# Patient Record
Sex: Female | Born: 1935 | Race: White | Hispanic: No | Smoking: Never smoker
Health system: Southern US, Community
[De-identification: ages and names within clinical notes are randomized; demographics above are authoritative.]

## PROBLEM LIST (undated history)

## (undated) DIAGNOSIS — R1310 Dysphagia, unspecified: Secondary | ICD-10-CM

## (undated) DIAGNOSIS — M810 Age-related osteoporosis without current pathological fracture: Secondary | ICD-10-CM

## (undated) DIAGNOSIS — W19XXXA Unspecified fall, initial encounter: Secondary | ICD-10-CM

## (undated) DIAGNOSIS — I10 Essential (primary) hypertension: Secondary | ICD-10-CM

## (undated) DIAGNOSIS — M1990 Unspecified osteoarthritis, unspecified site: Secondary | ICD-10-CM

## (undated) HISTORY — DX: Essential (primary) hypertension: I10

## (undated) HISTORY — PX: HIP SURGERY: SHX245

## (undated) HISTORY — DX: Unspecified osteoarthritis, unspecified site: M19.90

---

## 2015-02-26 DIAGNOSIS — Z23 Encounter for immunization: Secondary | ICD-10-CM | POA: Diagnosis not present

## 2015-02-26 DIAGNOSIS — M1990 Unspecified osteoarthritis, unspecified site: Secondary | ICD-10-CM | POA: Diagnosis not present

## 2015-02-26 DIAGNOSIS — H9193 Unspecified hearing loss, bilateral: Secondary | ICD-10-CM | POA: Diagnosis not present

## 2015-02-26 DIAGNOSIS — Z1329 Encounter for screening for other suspected endocrine disorder: Secondary | ICD-10-CM | POA: Diagnosis not present

## 2015-02-26 DIAGNOSIS — R1310 Dysphagia, unspecified: Secondary | ICD-10-CM | POA: Diagnosis not present

## 2015-02-26 DIAGNOSIS — M810 Age-related osteoporosis without current pathological fracture: Secondary | ICD-10-CM | POA: Diagnosis not present

## 2015-02-26 DIAGNOSIS — Z0000 Encounter for general adult medical examination without abnormal findings: Secondary | ICD-10-CM | POA: Diagnosis not present

## 2015-02-26 DIAGNOSIS — R42 Dizziness and giddiness: Secondary | ICD-10-CM | POA: Diagnosis not present

## 2015-02-26 DIAGNOSIS — Z87898 Personal history of other specified conditions: Secondary | ICD-10-CM | POA: Diagnosis not present

## 2015-04-24 DIAGNOSIS — H04123 Dry eye syndrome of bilateral lacrimal glands: Secondary | ICD-10-CM | POA: Diagnosis not present

## 2015-06-26 DIAGNOSIS — Z23 Encounter for immunization: Secondary | ICD-10-CM | POA: Diagnosis not present

## 2015-06-26 DIAGNOSIS — D485 Neoplasm of uncertain behavior of skin: Secondary | ICD-10-CM | POA: Diagnosis not present

## 2015-06-26 DIAGNOSIS — H04123 Dry eye syndrome of bilateral lacrimal glands: Secondary | ICD-10-CM | POA: Diagnosis not present

## 2015-07-21 DIAGNOSIS — D485 Neoplasm of uncertain behavior of skin: Secondary | ICD-10-CM | POA: Diagnosis not present

## 2015-07-21 DIAGNOSIS — L905 Scar conditions and fibrosis of skin: Secondary | ICD-10-CM | POA: Diagnosis not present

## 2015-10-07 DIAGNOSIS — R1310 Dysphagia, unspecified: Secondary | ICD-10-CM | POA: Diagnosis not present

## 2015-10-07 DIAGNOSIS — R946 Abnormal results of thyroid function studies: Secondary | ICD-10-CM | POA: Diagnosis not present

## 2015-10-07 DIAGNOSIS — R531 Weakness: Secondary | ICD-10-CM | POA: Diagnosis not present

## 2015-10-07 DIAGNOSIS — R42 Dizziness and giddiness: Secondary | ICD-10-CM | POA: Diagnosis not present

## 2015-10-13 ENCOUNTER — Other Ambulatory Visit (HOSPITAL_COMMUNITY): Payer: Self-pay | Admitting: Family Medicine

## 2015-10-13 DIAGNOSIS — R1310 Dysphagia, unspecified: Secondary | ICD-10-CM

## 2015-10-15 ENCOUNTER — Ambulatory Visit (HOSPITAL_COMMUNITY): Admission: RE | Admit: 2015-10-15 | Payer: Commercial Managed Care - HMO | Source: Ambulatory Visit

## 2015-10-22 ENCOUNTER — Other Ambulatory Visit (HOSPITAL_COMMUNITY): Payer: Self-pay | Admitting: Family Medicine

## 2015-10-22 DIAGNOSIS — R1310 Dysphagia, unspecified: Secondary | ICD-10-CM

## 2015-10-30 ENCOUNTER — Ambulatory Visit (HOSPITAL_COMMUNITY): Payer: Commercial Managed Care - HMO | Attending: Family Medicine | Admitting: Speech Pathology

## 2015-10-30 ENCOUNTER — Encounter (HOSPITAL_COMMUNITY): Payer: Self-pay | Admitting: Speech Pathology

## 2015-10-30 ENCOUNTER — Encounter (HOSPITAL_COMMUNITY): Payer: Self-pay | Admitting: Emergency Medicine

## 2015-10-30 ENCOUNTER — Emergency Department (HOSPITAL_COMMUNITY)
Admission: EM | Admit: 2015-10-30 | Discharge: 2015-10-30 | Disposition: A | Discharge status: Home / Self Care | Payer: Commercial Managed Care - HMO | Attending: Emergency Medicine | Admitting: Emergency Medicine

## 2015-10-30 ENCOUNTER — Other Ambulatory Visit: Payer: Self-pay

## 2015-10-30 ENCOUNTER — Emergency Department (HOSPITAL_COMMUNITY): Payer: Commercial Managed Care - HMO

## 2015-10-30 ENCOUNTER — Ambulatory Visit (HOSPITAL_COMMUNITY)
Admission: RE | Admit: 2015-10-30 | Discharge: 2015-10-30 | Disposition: A | Discharge status: Home / Self Care | Payer: Commercial Managed Care - HMO | Source: Ambulatory Visit | Attending: Family Medicine | Admitting: Family Medicine

## 2015-10-30 DIAGNOSIS — Z79899 Other long term (current) drug therapy: Secondary | ICD-10-CM | POA: Diagnosis not present

## 2015-10-30 DIAGNOSIS — R0789 Other chest pain: Secondary | ICD-10-CM | POA: Diagnosis not present

## 2015-10-30 DIAGNOSIS — R1310 Dysphagia, unspecified: Secondary | ICD-10-CM | POA: Insufficient documentation

## 2015-10-30 DIAGNOSIS — T17300A Unspecified foreign body in larynx causing asphyxiation, initial encounter: Secondary | ICD-10-CM | POA: Diagnosis not present

## 2015-10-30 DIAGNOSIS — J449 Chronic obstructive pulmonary disease, unspecified: Secondary | ICD-10-CM | POA: Diagnosis not present

## 2015-10-30 DIAGNOSIS — R634 Abnormal weight loss: Secondary | ICD-10-CM

## 2015-10-30 HISTORY — DX: Unspecified fall, initial encounter: W19.XXXA

## 2015-10-30 HISTORY — DX: Dysphagia, unspecified: R13.10

## 2015-10-30 HISTORY — DX: Age-related osteoporosis without current pathological fracture: M81.0

## 2015-10-30 LAB — CBC WITH DIFFERENTIAL/PLATELET
BASOS ABS: 0 10*3/uL (ref 0.0–0.1)
BASOS PCT: 0 %
EOS PCT: 0 %
Eosinophils Absolute: 0 10*3/uL (ref 0.0–0.7)
HEMATOCRIT: 39.5 % (ref 36.0–46.0)
Hemoglobin: 13.1 g/dL (ref 12.0–15.0)
LYMPHS PCT: 8 %
Lymphs Abs: 0.5 10*3/uL — ABNORMAL LOW (ref 0.7–4.0)
MCH: 29 pg (ref 26.0–34.0)
MCHC: 33.2 g/dL (ref 30.0–36.0)
MCV: 87.6 fL (ref 78.0–100.0)
MONO ABS: 0.4 10*3/uL (ref 0.1–1.0)
MONOS PCT: 7 %
Neutro Abs: 4.8 10*3/uL (ref 1.7–7.7)
Neutrophils Relative %: 85 %
PLATELETS: 262 10*3/uL (ref 150–400)
RBC: 4.51 MIL/uL (ref 3.87–5.11)
RDW: 13 % (ref 11.5–15.5)
WBC: 5.7 10*3/uL (ref 4.0–10.5)

## 2015-10-30 LAB — TROPONIN I
Troponin I: <0.03 ng/mL (ref <0.031)
Troponin I: <0.03 ng/mL (ref <0.031)

## 2015-10-30 LAB — BASIC METABOLIC PANEL
Anion gap: 7 (ref 5–15)
BUN: 20 mg/dL (ref 6–20)
CALCIUM: 9.6 mg/dL (ref 8.9–10.3)
CO2: 25 mmol/L (ref 22–32)
Chloride: 102 mmol/L (ref 101–111)
Creatinine, Ser: 0.88 mg/dL (ref 0.44–1.00)
GFR calc Af Amer: >60 mL/min (ref >60)
GFR, EST NON AFRICAN AMERICAN: 54 mL/min — AB (ref >60)
GLUCOSE: 110 mg/dL — AB (ref 65–99)
Potassium: 4.4 mmol/L (ref 3.5–5.1)
Sodium: 134 mmol/L — ABNORMAL LOW (ref 135–145)

## 2015-10-30 MED ORDER — TRAMADOL HCL 50 MG PO TABS: 50.0000 mg | ORAL_TABLET | Freq: Four times a day (QID) | ORAL | Status: DC | PRN

## 2015-10-30 NOTE — Therapy (Signed)
[place], [place], [address] Phone: [phone]   Fax:  [phone]  Modified Barium Swallow  Patient Details  Name: Gina Finley MRN: [id number] Date of Birth: Jan 01, 1918 No Data Recorded  Encounter Date: 10/30/2015      End of Session - 10/30/15 1838    Visit Number 1   Number of Visits 1   Authorization Type [employer]   SLP Start Time [id number]   SLP Stop Time  1400   SLP Time Calculation (min) 42 min   Activity Tolerance Patient tolerated treatment well;Other (comment)  Pt sat forcefully in chair and then felt pain in substernal area      Past Medical History  Diagnosis Date  . Dysphagia   . Osteoporosis   . Fall     Past Surgical History  Procedure Laterality Date  . Hip surgery      There were no vitals filed for this visit.  Visit Diagnosis: Dysphagia      Subjective Assessment - 10/30/15 1818    Subjective "I have trouble swallowing meats and mushy stuff...bananas."   Special Tests MBSS   Currently in Pain? No/denies             General - 10/30/15 1819    General Information   Date of Onset 10/07/15   HPI Ms. Damitra Santry is a 82 yo woman who was referred by Dr. Aura Dials for MBSS secondary to pt report of difficulty swallowing. She had MBSS in [place] in 2013 which showed aspiration of thin liquids (recommendation for mechanical soft and nectar-thick liquids). She only takes one medication, lives with her daughter, and consumes regular textures and all liquids. Her daughter accompanied her to the evaluation.   Type of Study MBS-Modified Barium Swallow Study   Previous Swallow Assessment 05/25/2012: aspiration of thin liquids with recommendation for mechanical soft textures and nectar-thick liquids   Diet Prior to this Study Regular;Thin liquids   Temperature Spikes Noted No   Respiratory Status Room air   History of Recent Intubation No   Behavior/Cognition  Alert;Cooperative;Pleasant mood   Oral Cavity Assessment Within Functional Limits   Oral Care Completed by SLP No   Oral Cavity - Dentition Dentures, top;Dentures, bottom   Vision Functional for self feeding   Self-Feeding Abilities Able to feed self   Patient Positioning Upright in chair   Baseline Vocal Quality Normal   Volitional Cough Weak   Volitional Swallow Able to elicit   Anatomy Within functional limits   Pharyngeal Secretions Not observed secondary MBS            Oral Preparation/Oral Phase - 10/30/15 1831    Oral Preparation/Oral Phase   Oral Phase Impaired   Oral - Nectar   Oral - Nectar Cup Piecemeal swallowing;Weak ligual manipulation   Oral - Thin   Oral - Thin Cup Piecemeal swallowing;Weak ligual manipulation   Oral - Solids   Oral - Puree Piecemeal swallowing;Weak ligual manipulation   Oral - Regular Piecemeal swallowing;Weak ligual manipulation   Electrical stimulation - Oral Phase   Was Electrical Stimulation Used No          Pharyngeal Phase - 10/30/15 1832    Pharyngeal Phase   Pharyngeal Phase Impaired   Pharyngeal - Nectar   Pharyngeal- Nectar Cup Delayed swallow initiation;Swallow initiation at pyriform sinus;Reduced epiglottic inversion;Reduced airway/laryngeal closure;Reduced tongue base retraction;Penetration/Aspiration during swallow;Trace aspiration;Pharyngeal residue - valleculae;Pharyngeal residue - pyriform   Pharyngeal Material does  not enter airway;Material enters airway, passes BELOW cords then ejected out;Material enters airway, remains ABOVE vocal cords and not ejected out;Material enters airway, remains ABOVE vocal cords then ejected out   Pharyngeal - Thin   Pharyngeal- Thin Cup Delayed swallow initiation;Swallow initiation at pyriform sinus;Reduced epiglottic inversion;Reduced airway/laryngeal closure;Reduced tongue base retraction;Penetration/Aspiration during swallow;Trace aspiration;Pharyngeal residue - valleculae;Pharyngeal  residue - pyriform   Pharyngeal Material does not enter airway;Material enters airway, remains ABOVE vocal cords then ejected out;Material enters airway, remains ABOVE vocal cords and not ejected out;Material enters airway, passes BELOW cords without attempt by patient to eject out (silent aspiration);Material enters airway, passes BELOW cords then ejected out   Pharyngeal- Thin Straw Delayed swallow initiation;Swallow initiation at pyriform sinus;Reduced epiglottic inversion;Reduced airway/laryngeal closure;Reduced tongue base retraction;Penetration/Aspiration during swallow;Trace aspiration;Pharyngeal residue - valleculae;Pharyngeal residue - pyriform   Pharyngeal Material enters airway, passes BELOW cords without attempt by patient to eject out (silent aspiration);Material enters airway, passes BELOW cords then ejected out   Pharyngeal - Solids   Pharyngeal- Puree Delayed swallow initiation;Swallow initiation at vallecula;Reduced epiglottic inversion;Reduced tongue base retraction;Pharyngeal residue - valleculae;Pharyngeal residue - pyriform   Pharyngeal- Regular Delayed swallow initiation-vallecula;Reduced epiglottic inversion;Reduced tongue base retraction;Pharyngeal residue - valleculae;Pharyngeal residue - pyriform   Pharyngeal- Pill Not tested   Electrical Stimulation - Pharyngeal Phase   Was Electrical Stimulation Used No          Cricopharyngeal Phase - 10/30/15 1836    Cervical Esophageal Phase   Cervical Esophageal Phase Within functional limits  ? hiatal hernia                  Plan - 10/30/15 1839    Clinical Impression Statement Ms. Steinberger was accompanied by her daughter for today's MBSS. Pt has a history of dysphagia with a previous MBSS completed in 2013 with recommendation for mechanical soft textures and nectar-thick liquids, however she was upgraded to thin liquids and typical diet is regular textures with thin liquids. The patient reports difficulty swallowing  meats and "mushy food". She denies difficulty with liquids, however her daughter endorses occasional "choking" on liquids. Pt sat down on cushioned [place] chair rather forcefully and then a few minutes later complained of substernal pain. The study was completed and pt still in pain, so she was accompanied to the ER to see a physician.   Pt with moderate oropharyngeal phase dysphagia characterized by weak lingual manipulation and coordination resulting in difficulty forming cohesive bolus, piecemeal deglutition, premature spillage of all liquids with swallow trigger after spilling to the pyriforms; Pt with decreased tongue base retraction (weakness), epiglottic deflection, and laryngeal vestibule closure resulting in penetration of thin liquids (can be normal in advanced age) and trace aspiration of thins and nectars. Pt with mild/mod valleculare residue and pyriform residue after primary swallow. Aspiration of nectars was silent, but was quickly expelled from larynx spontaneously (quickly dipped beneath vocal folds and then removed). Pt did aspirate trace amounts of thin liquids and was only unable to remove one episode (first episode). She was subsequently cued to clear her throat/cough after sips of liquid which was effective in removing aspirate (however some still remained on underside of epiglottis). Daughter and pt deny history of pneumonia or frequent respiratory infections. I suspect some degree of trace chronic aspiration of thin liquids to which pt seems to tolerate, likely due to her excellent mobility status for age. Given good health, would recommend allowing thin liquids with use of strategies (small sip, no straw, clear throat/cough after each  sip and repeat with a dry swallow) and self-regulated regular textures (ie. slow cooked, tender meats). Pt will need to swallow 2-3x for each bite/sip to help clear pharynx. If appetite is poor, consider liquid supplements (boost, ensure) for ease of intake.  SLP cautioned that pt may need to have liquids thickened in the future if she becomes compromised (fall, hospitalization, URI). They were given written recommendations and contact information should they have further questions. Pt/daughter in agreement with plan.    Consulted and Agree with Plan of Care Patient;Family member/caregiver   Family Member Consulted daughter          G-Codes - 2015/11/02 1859    Functional Assessment Tool Used clinical judgment; MBSS   Functional Limitations Swallowing   Swallow Current Status [phone]) At least 1 percent but less than 20 percent impaired, limited or restricted   Swallow Discharge Status [phone]) At least 1 percent but less than 20 percent impaired, limited or restricted          Recommendations/Treatment - 2015/11/02 1836    Swallow Evaluation Recommendations   SLP Diet Recommendations Dysphagia 3 (mechanical soft);Thin;Nectar   Liquid Administration via [employer];No straw   Medication Administration Whole meds with puree   Supervision Patient able to self feed;Intermittent supervision to cue for compensatory strategies   Compensations Slow rate;Small sips/bites;Multiple dry swallows after each bite/sip;Clear throat intermittently;Effortful swallow   Postural Changes Seated upright at 90 degrees;Remain upright for at least 30 minutes after feeds/meals          Prognosis - November 02, 2015 1838    Prognosis   Prognosis for Safe Diet Advancement Fair   Barriers to Reach Goals Severity of deficits   Individuals Consulted   Consulted and Agree with Results and Recommendations Patient;Family member/caregiver   Family Member Consulted Daughter   Report Sent to  Referring physician      Problem List There are no active problems to display for this patient.  Thank you,  Genene Churn, [place]  [facility] 11/02/2015, [place] [address] [place], [place], [address] Phone:  [phone]   Fax:  [phone]  Name: Yasheka Halleran MRN: [id number] Date of Birth: [birth year]

## 2015-10-30 NOTE — ED Provider Notes (Signed)
CSN: [id number]     Arrival date & time 10/30/15  1347 History   First MD Initiated Contact with Patient 10/30/15 1508     Chief Complaint  Patient presents with  . Chest Pain     (Consider location/radiation/quality/duration/timing/severity/associated sxs/prior Treatment) The history is provided by the patient and a relative.   82 year old the woman felt fine earlier today was scheduled for a barium swallow study due to difficulty swallowing. Patient when she went there is head down forcefully and then started to complain of pain in her lower substernal area and lower chest area. Having no difficulty breathing with it. I brought over here for further evaluation. Vital signs did note a low-grade fever at 100.2. Patient did not feel like she has been sick. Has not had chest pain like this before. Patient patient states that it is present only with breathing and with movement.  Past Medical History  Diagnosis Date  . Dysphagia   . Osteoporosis   . Fall    Past Surgical History  Procedure Laterality Date  . Hip surgery     No family history on file. Social History  Substance Use Topics  . Smoking status: Never Smoker   . Smokeless tobacco: Not on file  . Alcohol Use: No   OB History    No data available     Review of Systems  Constitutional: Negative for fever.  HENT: Negative for congestion.   Eyes: Negative for visual disturbance.  Respiratory: Negative for shortness of breath.   Cardiovascular: Positive for chest pain.  Gastrointestinal: Negative for nausea, vomiting and abdominal pain.  Genitourinary: Negative for dysuria.  Musculoskeletal: Negative for back pain.  Skin: Negative for rash.  Neurological: Negative for headaches.  Hematological: Does not bruise/bleed easily.  Psychiatric/Behavioral: Negative for confusion.      Allergies  Review of patient's allergies indicates no known allergies.  Home Medications   Prior to Admission medications   Medication  Sig Start Date End Date Taking? Authorizing Provider  metoprolol succinate (TOPROL-XL) 25 MG 24 hr tablet  10/15/15  Yes Historical Provider, MD  traMADol (ULTRAM) 50 MG tablet Take 1 tablet (50 mg total) by mouth every 6 (six) hours as needed. 10/30/15   Fredia Sorrow, MD   BP 161/75 mmHg  Pulse 77  Temp(Src) 100.2 F (37.9 C) (Oral)  Resp 18  Ht 5\' 4"  (1.626 m)  Wt 37.195 kg  BMI 14.07 kg/m2  SpO2 97% Physical Exam  Constitutional: She is oriented to person, place, and time. She appears well-developed and well-nourished. No distress.  HENT:  Head: Normocephalic and atraumatic.  Mouth/Throat: Oropharynx is clear and moist.  Eyes: Conjunctivae and EOM are normal. Pupils are equal, round, and reactive to light.  Neck: Normal range of motion. Neck supple.  Cardiovascular: Normal rate, regular rhythm and normal heart sounds.   No murmur heard. Pulmonary/Chest: Effort normal and breath sounds normal. No respiratory distress.  Abdominal: Bowel sounds are normal. There is no tenderness.  Musculoskeletal: Normal range of motion. She exhibits no edema.  Neurological: She is alert and oriented to person, place, and time. No cranial nerve deficit. She exhibits normal muscle tone. Coordination normal.  Baseline heart appearing and now slight speech impediment. All baseline.  Nursing note and vitals reviewed.   ED Course  Procedures (including critical care time) Labs Review Labs Reviewed  BASIC METABOLIC PANEL - Abnormal; Notable for the following:    Sodium 134 (*)    Glucose, Bld 110 (*)  GFR calc non Af Amer 54 (*)    All other components within normal limits  CBC WITH DIFFERENTIAL/PLATELET - Abnormal; Notable for the following:    Lymphs Abs 0.5 (*)    All other components within normal limits  TROPONIN I  TROPONIN I   Results for orders placed or performed during the hospital encounter of [id number]  Basic metabolic panel  Result Value Ref Range   Sodium 134 (L) 135 - 145  mmol/L   Potassium 4.4 3.5 - 5.1 mmol/L   Chloride 102 101 - 111 mmol/L   CO2 25 22 - 32 mmol/L   Glucose, Bld 110 (H) 65 - 99 mg/dL   BUN 20 6 - 20 mg/dL   Creatinine, Ser 0.88 0.44 - 1.00 mg/dL   Calcium 9.6 8.9 - 10.3 mg/dL   GFR calc non Af Amer 54 (L) >60 mL/min   GFR calc Af Amer >60 >60 mL/min   Anion gap 7 5 - 15  CBC with Differential/Platelet  Result Value Ref Range   WBC 5.7 4.0 - 10.5 K/uL   RBC 4.51 3.87 - 5.11 MIL/uL   Hemoglobin 13.1 12.0 - 15.0 g/dL   HCT 39.5 36.0 - 46.0 %   MCV 87.6 78.0 - 100.0 fL   MCH 29.0 26.0 - 34.0 pg   MCHC 33.2 30.0 - 36.0 g/dL   RDW 13.0 11.5 - 15.5 %   Platelets 262 150 - 400 K/uL   Neutrophils Relative % 85 %   Neutro Abs 4.8 1.7 - 7.7 K/uL   Lymphocytes Relative 8 %   Lymphs Abs 0.5 (L) 0.7 - 4.0 K/uL   Monocytes Relative 7 %   Monocytes Absolute 0.4 0.1 - 1.0 K/uL   Eosinophils Relative 0 %   Eosinophils Absolute 0.0 0.0 - 0.7 K/uL   Basophils Relative 0 %   Basophils Absolute 0.0 0.0 - 0.1 K/uL  Troponin I  Result Value Ref Range   Troponin I <0.03 <0.031 ng/mL  Troponin I  Result Value Ref Range   Troponin I <0.03 <0.031 ng/mL   Results for orders placed or performed during the hospital encounter of [id number]  Basic metabolic panel  Result Value Ref Range   Sodium 134 (L) 135 - 145 mmol/L   Potassium 4.4 3.5 - 5.1 mmol/L   Chloride 102 101 - 111 mmol/L   CO2 25 22 - 32 mmol/L   Glucose, Bld 110 (H) 65 - 99 mg/dL   BUN 20 6 - 20 mg/dL   Creatinine, Ser 0.88 0.44 - 1.00 mg/dL   Calcium 9.6 8.9 - 10.3 mg/dL   GFR calc non Af Amer 54 (L) >60 mL/min   GFR calc Af Amer >60 >60 mL/min   Anion gap 7 5 - 15  CBC with Differential/Platelet  Result Value Ref Range   WBC 5.7 4.0 - 10.5 K/uL   RBC 4.51 3.87 - 5.11 MIL/uL   Hemoglobin 13.1 12.0 - 15.0 g/dL   HCT 39.5 36.0 - 46.0 %   MCV 87.6 78.0 - 100.0 fL   MCH 29.0 26.0 - 34.0 pg   MCHC 33.2 30.0 - 36.0 g/dL   RDW 13.0 11.5 - 15.5 %   Platelets 262 150 - 400 K/uL    Neutrophils Relative % 85 %   Neutro Abs 4.8 1.7 - 7.7 K/uL   Lymphocytes Relative 8 %   Lymphs Abs 0.5 (L) 0.7 - 4.0 K/uL   Monocytes Relative 7 %   Monocytes Absolute 0.4  0.1 - 1.0 K/uL   Eosinophils Relative 0 %   Eosinophils Absolute 0.0 0.0 - 0.7 K/uL   Basophils Relative 0 %   Basophils Absolute 0.0 0.0 - 0.1 K/uL  Troponin I  Result Value Ref Range   Troponin I <0.03 <0.031 ng/mL  Troponin I  Result Value Ref Range   Troponin I <0.03 <0.031 ng/mL     Imaging Review Dg Chest 2 View  10/30/2015  CLINICAL DATA:  Difficulty swallowing, pain in substernal area after sitting down forcefully EXAM: CHEST  2 VIEW COMPARISON:  None FINDINGS: Normal heart size and pulmonary vascularity. Atherosclerotic calcification aorta. Large hiatal hernia. Emphysematous changes without infiltrate, pleural effusion or pneumothorax. Diffuse osseous demineralization. Scattered smooth concavities in vertebral endplates. No definite fractures are evident though sensitivity for fractures is decreased in the setting of marked demineralization. No retrosternal soft tissue density is definitely seen. IMPRESSION: Large hiatal hernia. COPD changes. Marked osseous demineralization without definite acute bony abnormalities. Electronically Signed   By: Lavonia Dana M.D.   On: 10/30/2015 14:25   Dg Op Swallowing Func-medicare/speech Path  10/30/2015  CLINICAL DATA:  Dysphagia for meats EXAM: MODIFIED BARIUM SWALLOW TECHNIQUE: Different consistencies of barium were administered orally to the patient by the Speech Pathologist. Imaging of the pharynx was performed in the lateral projection. FLUOROSCOPY TIME:  Radiation Exposure Index (as provided by the fluoroscopic device): Not provided If the device does not provide the exposure index: Fluoroscopy Time:  2 minutes 36 seconds Number of Acquired Images:  Single screen capture during fluoroscopy COMPARISON:  None FINDINGS: Thin liquid- with multiple sips of thin barium by cup,  premature spillover of contrast to the piriform sinuses is identified. Delayed initiation noted. Laryngeal penetration seen without aspiration initially. Minimal vallecular residual. With sequential swallows of thin barium by straw, premature spill over, delayed initiation, laryngeal penetration and aspiration of contrast were identified. No spontaneous cough reflex noted. Minimal vallecular residuals. Subsequent sip of thin barium by cup also demonstrated laryngeal penetration and aspiration with out cough reflex. Minimal vallecular residual. Nectar thick liquid- single swallow by cup demonstrated premature spillover to the vallecula and piriform sinuses, delayed initiation, laryngeal penetration and aspiration of contrast. No spontaneous cough reflex. Mild vallecular residuals noted, more than were seen with thin barium. Honey- not evaluated Pure- slightly delayed initiation. No laryngeal penetration or aspiration of applesauce consistency was seen. Oral and vallecular piriform sinus residuals were seen. Cracker-premature spillover to the vallecula with delayed initiation. No laryngeal penetration or aspiration. Vallecular residuals noted. Pure with cracker- not evaluated Barium tablet -  not evaluated IMPRESSION: Swallowing dysfunction as above. Please refer to the Speech Pathologists report for complete details and recommendations. Electronically Signed   By: Lavonia Dana M.D.   On: 10/30/2015 14:22   I have personally reviewed and evaluated these images and lab results as part of my medical decision-making.   EKG Interpretation None      ED ECG REPORT   Date: 10/30/2015  Rate: 76  Rhythm: normal sinus rhythm  QRS Axis: normal  Intervals: normal  ST/T Wave abnormalities: nonspecific T wave changes  Conduction Disutrbances:none  Narrative Interpretation:   Old EKG Reviewed: none available Abnormal R wave Progression early transition. Artifact but not limiting to interpretation. I have  personally reviewed the EKG tracing and agree with the computerized printout as noted.       MDM   Final diagnoses:  Chest wall pain    Workup for the chest pain clinically seem to  be more chest wall in nature. Workup without any findings to be suggestive of an acute coronary syndrome. EKG without acute changes troponins 2 are negative. Chest x-rays negative for pneumonia pneumothorax or pulmonary edema. Patient's symptoms are worse with movement and taking a deep breath. Addition also not concerned about pulmonary embolus. Oxygen saturation saturations are in the upper 90s. Patient without a leukocytosis. No tachycardia. Patient did have a low-grade fever upon presentation. Possibly could be coming down with an upper respiratory infection. Close observation will required for that. Patient certainly nontoxic no acute distress here.    Fredia Sorrow, MD 10/30/15 [phone]

## 2015-10-30 NOTE — Discharge Instructions (Signed)
A workup for the chest pain seems to be consistent with chest wall pain. Take the tramadol as needed for pain. Return for any new or worse symptoms.

## 2015-10-30 NOTE — ED Notes (Signed)
Pt having modified barium swallow study this morning for difficulty swallowing this morning.  Pt sat down forcefully and now has pain in substernal area.  Pt having no breathing difficulty at this time.  Pt brought in by [place] porter, speech therapy [phone] if needed. Pt alert and oriented.

## 2016-01-19 DIAGNOSIS — Z961 Presence of intraocular lens: Secondary | ICD-10-CM | POA: Diagnosis not present

## 2016-01-19 DIAGNOSIS — Z0100 Encounter for examination of eyes and vision without abnormal findings: Secondary | ICD-10-CM | POA: Diagnosis not present

## 2016-01-19 DIAGNOSIS — H04123 Dry eye syndrome of bilateral lacrimal glands: Secondary | ICD-10-CM | POA: Diagnosis not present

## 2016-01-20 DIAGNOSIS — R42 Dizziness and giddiness: Secondary | ICD-10-CM | POA: Diagnosis not present

## 2016-01-29 DIAGNOSIS — R42 Dizziness and giddiness: Secondary | ICD-10-CM | POA: Diagnosis not present

## 2016-01-29 DIAGNOSIS — I10 Essential (primary) hypertension: Secondary | ICD-10-CM | POA: Diagnosis not present

## 2016-04-08 DIAGNOSIS — R55 Syncope and collapse: Secondary | ICD-10-CM | POA: Diagnosis not present

## 2016-04-08 DIAGNOSIS — R42 Dizziness and giddiness: Secondary | ICD-10-CM | POA: Diagnosis not present

## 2016-04-08 DIAGNOSIS — R000 Tachycardia, unspecified: Secondary | ICD-10-CM | POA: Diagnosis not present

## 2016-04-08 DIAGNOSIS — I10 Essential (primary) hypertension: Secondary | ICD-10-CM | POA: Diagnosis not present

## 2016-05-07 DIAGNOSIS — R55 Syncope and collapse: Secondary | ICD-10-CM | POA: Diagnosis not present

## 2016-05-24 DIAGNOSIS — R42 Dizziness and giddiness: Secondary | ICD-10-CM | POA: Diagnosis not present

## 2016-05-24 DIAGNOSIS — R55 Syncope and collapse: Secondary | ICD-10-CM | POA: Diagnosis not present

## 2016-05-24 DIAGNOSIS — R000 Tachycardia, unspecified: Secondary | ICD-10-CM | POA: Diagnosis not present

## 2016-05-24 DIAGNOSIS — I10 Essential (primary) hypertension: Secondary | ICD-10-CM | POA: Diagnosis not present

## 2016-06-09 ENCOUNTER — Emergency Department (HOSPITAL_COMMUNITY): Payer: Commercial Managed Care - HMO

## 2016-06-09 ENCOUNTER — Encounter (HOSPITAL_COMMUNITY): Payer: Self-pay | Admitting: Emergency Medicine

## 2016-06-09 ENCOUNTER — Emergency Department (HOSPITAL_COMMUNITY)
Admission: EM | Admit: 2016-06-09 | Discharge: 2016-06-09 | Disposition: A | Discharge status: Home / Self Care | Payer: Commercial Managed Care - HMO | Attending: Emergency Medicine | Admitting: Emergency Medicine

## 2016-06-09 DIAGNOSIS — Y999 Unspecified external cause status: Secondary | ICD-10-CM | POA: Diagnosis not present

## 2016-06-09 DIAGNOSIS — M79652 Pain in left thigh: Secondary | ICD-10-CM | POA: Diagnosis not present

## 2016-06-09 DIAGNOSIS — Z7982 Long term (current) use of aspirin: Secondary | ICD-10-CM | POA: Diagnosis not present

## 2016-06-09 DIAGNOSIS — Y939 Activity, unspecified: Secondary | ICD-10-CM | POA: Insufficient documentation

## 2016-06-09 DIAGNOSIS — S2241XA Multiple fractures of ribs, right side, initial encounter for closed fracture: Secondary | ICD-10-CM | POA: Insufficient documentation

## 2016-06-09 DIAGNOSIS — Y929 Unspecified place or not applicable: Secondary | ICD-10-CM | POA: Diagnosis not present

## 2016-06-09 DIAGNOSIS — Z79899 Other long term (current) drug therapy: Secondary | ICD-10-CM | POA: Insufficient documentation

## 2016-06-09 DIAGNOSIS — S70921A Unspecified superficial injury of right thigh, initial encounter: Secondary | ICD-10-CM | POA: Diagnosis not present

## 2016-06-09 DIAGNOSIS — S299XXA Unspecified injury of thorax, initial encounter: Secondary | ICD-10-CM | POA: Diagnosis present

## 2016-06-09 DIAGNOSIS — W010XXA Fall on same level from slipping, tripping and stumbling without subsequent striking against object, initial encounter: Secondary | ICD-10-CM | POA: Insufficient documentation

## 2016-06-09 DIAGNOSIS — S3993XA Unspecified injury of pelvis, initial encounter: Secondary | ICD-10-CM | POA: Diagnosis not present

## 2016-06-09 DIAGNOSIS — S70922A Unspecified superficial injury of left thigh, initial encounter: Secondary | ICD-10-CM | POA: Diagnosis not present

## 2016-06-09 DIAGNOSIS — M79651 Pain in right thigh: Secondary | ICD-10-CM | POA: Diagnosis not present

## 2016-06-09 DIAGNOSIS — M25559 Pain in unspecified hip: Secondary | ICD-10-CM | POA: Diagnosis not present

## 2016-06-09 DIAGNOSIS — W19XXXA Unspecified fall, initial encounter: Secondary | ICD-10-CM

## 2016-06-09 MED ORDER — TRAMADOL HCL 50 MG PO TABS: 50.0000 mg | ORAL_TABLET | Freq: Four times a day (QID) | ORAL | 1 refills | Status: DC | PRN

## 2016-06-09 NOTE — Discharge Instructions (Signed)
You have rib fractures on the right side. These may be old. No obvious pelvic or femur fracture.  Try Tylenol for pain. I've given you a prescription pain pill. Try one half of these tablets if you need more pain medication.

## 2016-06-09 NOTE — ED Provider Notes (Signed)
[facility] DEPT Provider Note   CSN: [id number] Arrival date & time: 06/09/16  1224   By signing my name below, I, Gina Finley, attest that this documentation has been prepared under the direction and in the presence of Gina Christen, MD . Electronically Signed: Neta Finley, ED Scribe. 06/09/2016. 1:59 PM.   History   Chief Complaint Chief Complaint  Patient presents with  . Fall    The history is provided by the patient. No language interpreter was used.   HPI Comments:  Gina Finley is a 82 y.o. female who presents to the Emergency Department s/p a fall that occurred earlier today. Pt states that she accidentally tripped and fell backwards. Pt complains of associated right lateral rib pain, right mid-lateral rib pain, and bilateral buttock pain. No alleviating factors noted. Pt denies head/neck trauma, dizziness, chest pain, SOB.    Past Medical History:  Diagnosis Date  . Dysphagia   . Fall   . Osteoporosis     There are no active problems to display for this patient.   Past Surgical History:  Procedure Laterality Date  . HIP SURGERY      OB History    No data available       Home Medications    Prior to Admission medications   Medication Sig Start Date End Date Taking? Authorizing Provider  aspirin 81 MG chewable tablet Chew 81 mg by mouth daily.   Yes Historical Provider, MD  metoprolol succinate (TOPROL-XL) 25 MG 24 hr tablet Take 12.5 mg by mouth daily.  10/15/15  Yes Historical Provider, MD  Multiple Vitamins-Minerals (ONE-A-DAY 50 PLUS PO) Take 1 tablet by mouth daily.   Yes Historical Provider, MD  Polyethyl Glycol-Propyl Glycol (SYSTANE OP) Apply 1 drop to eye daily.   Yes Historical Provider, MD  traMADol (ULTRAM) 50 MG tablet Take 1 tablet (50 mg total) by mouth every 6 (six) hours as needed. 06/09/16   Gina Christen, MD    Family History No family history on file.  Social History Social History  Substance Use Topics  . Smoking  status: Never Smoker  . Smokeless tobacco: Never Used  . Alcohol use No     Allergies   Review of patient's allergies indicates no known allergies.   Review of Systems Review of Systems 10 systems reviewed and all are negative for acute change except as noted in the HPI.    Physical Exam Updated Vital Signs BP 145/68 (BP Location: Right Arm)   Pulse 91   Temp 98.2 F (36.8 C) (Oral)   Resp 20   Ht 5\' 3"  (1.6 m)   Wt 80 lb (36.3 kg)   SpO2 95%   BMI 14.17 kg/m   Physical Exam  Constitutional: She is oriented to person, place, and time. No distress.  Frail, but alert  HENT:  Head: Normocephalic and atraumatic.  Eyes: Conjunctivae are normal.  Neck: Neck supple.  Cardiovascular: Normal rate and regular rhythm.   No murmur heard. Pulmonary/Chest: Effort normal and breath sounds normal. No respiratory distress.  Tender right mid-lateral chest area  Abdominal: Soft. Bowel sounds are normal. There is no tenderness.  Musculoskeletal: Normal range of motion. She exhibits no edema.  Tender over ischial tuberosity bilaterally  Neurological: She is alert and oriented to person, place, and time.  Skin: Skin is warm and dry.  Psychiatric: She has a normal mood and affect. Her behavior is normal.  Nursing note and vitals reviewed.    ED Treatments /  Results  DIAGNOSTIC STUDIES:  Oxygen Saturation is 96% on RA, normal by my interpretation.    COORDINATION OF CARE:  1:59 PM Will order x-ray of pelvis,  bilateral femur, R ribs. Discussed treatment plan with pt at bedside and pt agreed to plan.   Labs (all labs ordered are listed, but only abnormal results are displayed) Labs Reviewed - No data to display  EKG  EKG Interpretation None       Radiology Dg Ribs Unilateral W/chest Right  Result Date: 06/09/2016 CLINICAL DATA:  Fall.  Pain. EXAM: RIGHT RIBS AND CHEST - 3+ VIEW COMPARISON:  10/30/2015. FINDINGS: Diffuse severe osteopenia present making evaluation  difficult. Nondisplaced multiple rib fractures on the right of undetermined age noted. Stable thoracic spine degenerative changes and multiple compression fractures. There is no pneumothorax. Large hiatal hernia. Stable cardiomegaly. IMPRESSION: Severe osteopenia making evaluation difficult. Nondisplaced multiple right rib fractures are noted, these are of undetermined age. No pneumothorax. Electronically Signed   By: Marcello Moores  Register   On: 06/09/2016 14:16   Dg Pelvis 1-2 Views  Result Date: 06/09/2016 CLINICAL DATA:  Pain following fall . EXAM: PELVIS - 1-2 VIEW COMPARISON:  No recent prior. FINDINGS: Diffuse severe osteopenia is present. No acute bony abnormality identified. ORIF left hip. Peripheral vascular calcification. Pelvic calcification consistent with fibroids. IMPRESSION: Diffuse osteopenia and degenerative change. No acute abnormality. Prior ORIF left hip. Electronically Signed   By: Marcello Moores  Register   On: 06/09/2016 14:18   Dg Femur Min 2 Views Left  Result Date: 06/09/2016 CLINICAL DATA:  Fall.  Pain. EXAM: LEFT FEMUR 2 VIEWS COMPARISON:  No prior. FINDINGS: Plate screw fixation of the left hip. Diffuse osteopenia and degenerative change. No acute bony abnormality identified. Peripheral vascular calcification. IMPRESSION: 1. Plate screw fixation left hip. 2. Diffuse osteopenia degenerative change.  No acute abnormality. 3. Peripheral vascular disease . Electronically Signed   By: Marcello Moores  Register   On: 06/09/2016 14:20   Dg Femur Min 2 Views Right  Result Date: 06/09/2016 CLINICAL DATA:  Fall.  Pain . EXAM: RIGHT FEMUR 2 VIEWS COMPARISON:  No recent prior . FINDINGS: Diffuse osteopenia degenerative change. No acute bony abnormality identified. Peripheral vascular calcification . IMPRESSION: 1. Diffuse osteopenia degenerative change. No acute abnormality. No evidence of fracture. 2. Peripheral vascular disease. Electronically Signed   By: Marcello Moores  Register   On: 06/09/2016 14:19     Procedures Procedures (including critical care time)  Medications Ordered in ED Medications - No data to display   Initial Impression / Assessment and Plan / ED Course  I have reviewed the triage vital signs and the nursing notes.  Pertinent labs & imaging results that were available during my care of the patient were reviewed by me and considered in my medical decision making (see chart for details).  Clinical Course    Patient is alert and oriented. No neurological deficits. Plain films of pelvis and both femurs show no obvious fractures. Rib films reveal nondisplaced rib fractures of indeterminate age. This was discussed with the patient, her daughter, and son-in-law. Discharge medication tramadol  Final Clinical Impressions(s) / ED Diagnoses   Final diagnoses:  Fall, initial encounter  Closed fracture of multiple ribs of right side, initial encounter    New Prescriptions New Prescriptions   TRAMADOL (ULTRAM) 50 MG TABLET    Take 1 tablet (50 mg total) by mouth every 6 (six) hours as needed.  I personally performed the services described in this documentation, which was scribed in  my presence. The recorded information has been reviewed and is accurate.      Gina Christen, MD 06/09/16 [phone]

## 2016-06-09 NOTE — ED Triage Notes (Signed)
Pt c/o bilateral hip and right rib pain since slipping and falling on hardwood floor on Friday. Unknown loc. Pt has not been seen since fall.

## 2016-09-08 ENCOUNTER — Ambulatory Visit (INDEPENDENT_AMBULATORY_CARE_PROVIDER_SITE_OTHER): Payer: Medicare HMO | Admitting: Podiatry

## 2016-09-08 ENCOUNTER — Encounter: Payer: Self-pay | Admitting: Podiatry

## 2016-09-08 VITALS — BP 127/92 | HR 97 | Resp 18

## 2016-09-08 DIAGNOSIS — M79676 Pain in unspecified toe(s): Secondary | ICD-10-CM

## 2016-09-08 DIAGNOSIS — B351 Tinea unguium: Secondary | ICD-10-CM

## 2016-09-08 NOTE — Patient Instructions (Signed)
Today your foot examination demonstrated thickened and deformed toenails from fungal infection. I recommended periodic debridement of these nails in 3-4 month intervals

## 2016-09-08 NOTE — Progress Notes (Signed)
   Subjective:    Patient ID: Gina Finley, female    DOB: 01-12-1918, 83 y.o.   MRN: [id number]  HPI     This patient presents today with her daughter present in the treatment. The daughter speaking for her mother wears a hard time hearing and is requesting that an examination with a complaint of numbness and throbbing intermittently on and off weightbearing more so in the last several months without any specific treatment. Patient or patient's daughter denies history of diabetes. Also, patient's daughters requesting debridement of the toenails which she has attempted to trim, however, was not able to because of the thickness and deformity within the nails. There is some remote history of possible podiatric care for debridement of toenails by an [place] podiatrist [place] living with her daughter is present in the treatment room Patient and patient's daughter deny history of skin ulceration, claudication, or amputation  Review of Systems     Objective:   Physical Exam  Patient is hard of hearing, however does respond with the help of her daughter  Vascular: DP PT pulses trace palpable bilaterally Capillary reflex immediate bilaterally  Neurological: Sensation to 10 g monofilament wire patient has difficulty responding and/or hearing Vibratory sensation patient has difficult responding and/or hearing Ankle reflexes reactive bilaterally  Dermatological: Atrophic skin bilaterally No open skin lesions bilaterally The toenails elongated, brittle, discolored 6-10  Musculoskeletal: HAV bilaterally There is no pain or crepitus upon range of motion ankle, subtalar, midtarsal joints bilaterally      Assessment & Plan:   Assessment: Decrease pedal pulses without history of open skin lesions Difficulty in evaluating possible neuropathic symptoms because of difficulty in patient responding to examination Mycotic toenails 6-10 No   acute problems  Plan: Today I reviewed the results of the exam with patient and patient's daughter. At this time I made them aware that it could not give him a definitive diagnosis as to the exact nature of some of her symptoms. Continue to observe and record complaints the patient presented to office today I offered him debridement of the mycotic toenails and he verbally consents  The toenails 6-10 were debrided mechanically and electrically without any bleeding  Reappoint 3 months

## 2016-09-29 ENCOUNTER — Ambulatory Visit (INDEPENDENT_AMBULATORY_CARE_PROVIDER_SITE_OTHER): Payer: Medicare HMO | Admitting: Family Medicine

## 2016-09-29 ENCOUNTER — Encounter: Payer: Self-pay | Admitting: Family Medicine

## 2016-09-29 VITALS — BP 136/74 | HR 98 | Temp 98.2°F | Resp 16 | Ht 58.27 in | Wt 80.4 lb

## 2016-09-29 DIAGNOSIS — E538 Deficiency of other specified B group vitamins: Secondary | ICD-10-CM | POA: Diagnosis not present

## 2016-09-29 DIAGNOSIS — R1310 Dysphagia, unspecified: Secondary | ICD-10-CM

## 2016-09-29 DIAGNOSIS — R2681 Unsteadiness on feet: Secondary | ICD-10-CM

## 2016-09-29 DIAGNOSIS — R000 Tachycardia, unspecified: Secondary | ICD-10-CM

## 2016-09-29 DIAGNOSIS — M8000XD Age-related osteoporosis with current pathological fracture, unspecified site, subsequent encounter for fracture with routine healing: Secondary | ICD-10-CM | POA: Diagnosis not present

## 2016-09-29 DIAGNOSIS — R5383 Other fatigue: Secondary | ICD-10-CM

## 2016-09-29 DIAGNOSIS — M810 Age-related osteoporosis without current pathological fracture: Secondary | ICD-10-CM | POA: Insufficient documentation

## 2016-09-29 LAB — CBC WITH DIFFERENTIAL/PLATELET
Basophils Absolute: 44 cells/uL (ref 0–200)
Basophils Relative: 1 %
EOS PCT: 1 %
Eosinophils Absolute: 44 cells/uL (ref 15–500)
HEMATOCRIT: 38.8 % (ref 35.0–45.0)
Hemoglobin: 12.6 g/dL (ref 12.0–15.0)
LYMPHS PCT: 15 %
Lymphs Abs: 660 cells/uL — ABNORMAL LOW (ref 850–3900)
MCH: 28.4 pg (ref 27.0–33.0)
MCHC: 32.5 g/dL (ref 32.0–36.0)
MCV: 87.4 fL (ref 80.0–100.0)
MONOS PCT: 8 %
MPV: 10.5 fL (ref 7.5–12.5)
Monocytes Absolute: 352 cells/uL (ref 200–950)
Neutro Abs: 3300 cells/uL (ref 1500–7800)
Neutrophils Relative %: 75 %
PLATELETS: 263 10*3/uL (ref 140–400)
RBC: 4.44 MIL/uL (ref 3.80–5.10)
RDW: 13.6 % (ref 11.0–15.0)
WBC: 4.4 10*3/uL (ref 3.8–10.8)

## 2016-09-29 LAB — COMPREHENSIVE METABOLIC PANEL
ALT: 3 U/L — ABNORMAL LOW (ref 6–29)
AST: 19 U/L (ref 10–35)
Albumin: 3.8 g/dL (ref 3.6–5.1)
Alkaline Phosphatase: 60 U/L (ref 33–130)
BILIRUBIN TOTAL: 0.6 mg/dL (ref 0.2–1.2)
BUN: 25 mg/dL (ref 7–25)
CALCIUM: 9.8 mg/dL (ref 8.6–10.4)
CO2: 25 mmol/L (ref 20–31)
Chloride: 107 mmol/L (ref 98–110)
Creat: 0.89 mg/dL — ABNORMAL HIGH (ref 0.60–0.88)
GLUCOSE: 109 mg/dL — AB (ref 70–99)
Potassium: 3.9 mmol/L (ref 3.5–5.3)
SODIUM: 142 mmol/L (ref 135–146)
Total Protein: 6.6 g/dL (ref 6.1–8.1)

## 2016-09-29 NOTE — Assessment & Plan Note (Signed)
Continue low dose metoprolol seems to be tolerating.  Her fatigue is simply multifactorial with her age and general not be anything to be very active. She is also with protein malnutrition which is chronic discussed increasing her insurer given her some other type of supplement such as breeze,  boost,  Carnation. Interested in taking B-12. I will check a B-12 level otherwise she can take a sublingual

## 2016-09-29 NOTE — Patient Instructions (Addendum)
Add over the counter B12 Try Ensure or other supplement twice a day ( Breeze- fruity flavor, Boost, Carnation instant breakfast) We will call with  Lab results  F/U 4 months

## 2016-09-29 NOTE — Assessment & Plan Note (Signed)
Mechanical soft diet

## 2016-09-29 NOTE — Assessment & Plan Note (Addendum)
Continue calcium, uses cane, sometimes per report

## 2016-09-29 NOTE — Progress Notes (Signed)
Subjective:    Patient ID: Gina Finley, female    DOB: [birth year], 83 y.o.   MRN: [id number]  Patient presents for New Patient CPE (is not fasting) 83 year old female here with her daughter and son-in-law here to establish care. Her records reveal no significant cardiovascular history or respiratory history no history of any cancers. Osteoporosis and dysphasia was noted. Her previous primary care provider's note states that she had cardiac workup in 2015 after episodes of weakness she did have carotid Dopplers which showed a small amount of soft plaque with 10-30% stenosis, otherwise she had some tachycardia was put on metoprolol 25 mg once a day.  Her notes often's show where she comes in feeling "weak and wobbly" she does walk with a cane. [facility]  She's also followed by podiatry at recent nail trimming- Mycotic nails- Dr. Amalia Hailey   She did have a fall back in October 27 which resulted in nondisplaced rib fractures. She also injured her hip and occasionally has some soreness in the hip which they use Tylenol for She has swallowing evaluation back in February 2017 at that time given diagnoses of dysphasia 3 with mechanical soft diet  Optho- [facility]- on eye drops    He states that she just feels weak and has no energy she has not had any recent falls. She has very poor appetite in general she drinks one insure day at times she will have crackers and cheese has coffee she does like the nipple on sweets. Her daughter states that this been going on for years.  Review Of Systems:  GEN- denies fatigue, fever, weight loss,+weakness, recent illness HEENT- denies eye drainage, change in vision, nasal discharge, CVS- denies chest pain, palpitations RESP- denies SOB, cough, wheeze ABD- denies N/V, change in stools, abd pain GU- denies dysuria, hematuria, dribbling, incontinence MSK- + joint pain, muscle aches, injury Neuro-  denies headache, dizziness, syncope, seizure activity       Objective:    BP 136/74 (BP Location: Left Arm, Patient Position: Sitting, Cuff Size: Normal)   Pulse 98   Temp 98.2 F (36.8 C) (Oral)   Resp 16   Ht 4' 10.27" (1.48 m)   Wt 80 lb 6.4 oz (36.5 kg)   SpO2 97%   BMI 16.65 kg/m  GEN- NAD, alert and oriented x3,elderly female, hard of hearing ,has cane today,  HEENT- PERRL, EOMI, non injected sclera, pink conjunctiva, MMM, oropharynx clear Neck- Supple, no LAD  CVS- RRR, no murmur RESP-CTAB ABD-NABS,soft,NT,ND MSK- Kyphosis EXT- No edema Pulses- Radial  2+        Assessment & Plan:      I asked patient as well as daughter about end-of-life wishes and living well they do not have anything in place. Have given them a packet of information regarding this starts end-of-life decisions.   Problem List Items Addressed This Visit    Sinus tachycardia    Continue low dose metoprolol seems to be tolerating.  Her fatigue is simply multifactorial with her age and general not be anything to be very active. She is also with protein malnutrition which is chronic discussed increasing her insurer given her some other type of supplement such as breeze,  boost,  Carnation. Interested in taking B-12. I will check a B-12 level otherwise she can take a sublingual      Osteoporosis    Continue calcium, uses cane, sometimes per report       Gait  instability   Dysphagia    Mechanical soft diet       Other Visit Diagnoses    Fatigue, unspecified type    -  Primary   Relevant Orders   CBC with Differential/Platelet   Comprehensive metabolic panel   Vitamin [id number]      Note: This dictation was prepared with Dragon dictation along with smaller phrase technology. Any transcriptional errors that result from this process are unintentional.

## 2016-09-30 LAB — VITAMIN B12: Vitamin B-12: 573 pg/mL (ref 200–1100)

## 2016-10-22 ENCOUNTER — Other Ambulatory Visit: Payer: Self-pay | Admitting: *Deleted

## 2016-10-22 MED ORDER — METOPROLOL SUCCINATE ER 25 MG PO TB24: 12.5000 mg | ORAL_TABLET | Freq: Every day | ORAL | 1 refills | Status: DC

## 2016-12-08 ENCOUNTER — Ambulatory Visit (INDEPENDENT_AMBULATORY_CARE_PROVIDER_SITE_OTHER): Payer: Medicare HMO | Admitting: Podiatry

## 2016-12-08 ENCOUNTER — Encounter: Payer: Self-pay | Admitting: Podiatry

## 2016-12-08 DIAGNOSIS — L603 Nail dystrophy: Secondary | ICD-10-CM

## 2016-12-08 DIAGNOSIS — M79609 Pain in unspecified limb: Secondary | ICD-10-CM | POA: Diagnosis not present

## 2016-12-08 DIAGNOSIS — L608 Other nail disorders: Secondary | ICD-10-CM

## 2016-12-08 DIAGNOSIS — B351 Tinea unguium: Secondary | ICD-10-CM | POA: Diagnosis not present

## 2016-12-10 NOTE — Progress Notes (Signed)
   SUBJECTIVE Patient  presents to office today complaining of elongated, thickened nails. She reports pain to the bilateral great toes while ambulating in shoes. Patient is unable to trim their own nails.   OBJECTIVE General Patient is awake, alert, and oriented x 3 and in no acute distress. Derm Skin is dry and supple bilateral. Negative open lesions or macerations. Remaining integument unremarkable. Nails are tender, long, thickened and dystrophic with subungual debris, consistent with onychomycosis, 1-5 bilateral. No signs of infection noted. Vasc  DP and PT pedal pulses palpable bilaterally. Temperature gradient within normal limits.  Neuro Epicritic and protective threshold sensation diminished bilaterally.  Musculoskeletal Exam No symptomatic pedal deformities noted bilateral. Muscular strength within normal limits.  ASSESSMENT 1. Onychodystrophic nails 1-5 bilateral with hyperkeratosis of nails.  2. Onychomycosis of nail due to dermatophyte bilateral 3. Pain in foot bilateral  PLAN OF CARE 1. Patient evaluated today.  2. Instructed to maintain good pedal hygiene and foot care.  3. Mechanical debridement of nails 1-5 bilaterally performed using a nail nipper. Filed with dremel without incident.  4. Return to clinic in 3 mos.    Edrick Kins, DPM [facility]  Dr. Edrick Kins, [address]                                        [place], [place] [address]                Office [phone]  Fax [phone]

## 2017-01-20 DIAGNOSIS — H353132 Nonexudative age-related macular degeneration, bilateral, intermediate dry stage: Secondary | ICD-10-CM | POA: Diagnosis not present

## 2017-01-28 ENCOUNTER — Encounter: Payer: Self-pay | Admitting: Family Medicine

## 2017-01-28 ENCOUNTER — Ambulatory Visit (INDEPENDENT_AMBULATORY_CARE_PROVIDER_SITE_OTHER): Payer: Medicare HMO | Admitting: Family Medicine

## 2017-01-28 VITALS — BP 128/74 | HR 94 | Temp 98.0°F | Resp 16 | Ht 58.27 in | Wt 81.2 lb

## 2017-01-28 DIAGNOSIS — K5901 Slow transit constipation: Secondary | ICD-10-CM | POA: Diagnosis not present

## 2017-01-28 DIAGNOSIS — E46 Unspecified protein-calorie malnutrition: Secondary | ICD-10-CM | POA: Insufficient documentation

## 2017-01-28 DIAGNOSIS — R000 Tachycardia, unspecified: Secondary | ICD-10-CM

## 2017-01-28 DIAGNOSIS — K5900 Constipation, unspecified: Secondary | ICD-10-CM | POA: Insufficient documentation

## 2017-01-28 DIAGNOSIS — R2681 Unsteadiness on feet: Secondary | ICD-10-CM | POA: Diagnosis not present

## 2017-01-28 DIAGNOSIS — E440 Moderate protein-calorie malnutrition: Secondary | ICD-10-CM | POA: Diagnosis not present

## 2017-01-28 NOTE — Assessment & Plan Note (Signed)
Discussed use of MiraLAX as needed for constipation

## 2017-01-28 NOTE — Assessment & Plan Note (Signed)
Also discussed increasing her insurance to twice a day or evening using [employer]. She declines any change in her diet otherwise. She is at least keeping her weight steady.

## 2017-01-28 NOTE — Patient Instructions (Addendum)
Carnation instant breakfast  Or ensure twice a day  Continue current medication  Use the miralax for constipation  F/U 6 months

## 2017-01-28 NOTE — Assessment & Plan Note (Signed)
Currently controlled with Toprol she's not had any symptomatic episodes.

## 2017-01-28 NOTE — Assessment & Plan Note (Signed)
Discussed using the cane especially in the house and she should use her walker when she leaves to go out. She importantly can be very strong headed and does not like to use any of these dialysis with her gait.

## 2017-01-28 NOTE — Progress Notes (Signed)
   Subjective:    Patient ID: Gina Finley, female    DOB: Jul 08, 1918, 83 y.o.   MRN: [id number]  Patient presents for 4 month F/U (is not fasting) and Weakness (states that she feels weak all the time)  Pt here for F/U chronic medical problems   HTN- taking metoprolol as prescribed  MVI once a day  Eye drops only  Appetite is fair she drinks ensure once a day but does not really like it. She refuses to take pured food despite her dysphasia she states that she chops it finally. Her weight however is steady. She states that she does get tired very easily and often has to sit down when she is walking for any period of time. Her daughter states that she uses her cane sometimes she also has a walker but refuses to use this. She has not had new recent falls.  She has had constipation daughter did give her some MiraLAX which helps. No blood in stool. No abdominal pain.     Review Of Systems:  GEN- + fatigue, fever, weight loss,weakness, recent illness HEENT- denies eye drainage, change in vision, nasal discharge, CVS- denies chest pain, palpitations RESP- denies SOB, cough, wheeze ABD- denies N/V, change in stools, abd pain GU- denies dysuria, hematuria, dribbling, incontinence MSK- denies joint pain, muscle aches, injury Neuro- denies headache, dizziness, syncope, seizure activity       Objective:    BP 128/74   Pulse 94   Temp 98 F (36.7 C) (Oral)   Resp 16   Ht 4' 10.27" (1.48 m)   Wt 81 lb 3.2 oz (36.8 kg)   SpO2 98%   BMI 16.81 kg/m  GEN- NAD, alert and oriented x3, elderly frail apperaing, walks with cane  HEENT- PERRL, EOMI, non injected sclera, pink conjunctiva, MMM, oropharynx clear Neck- Supple, no thyromegaly CVS- RRR, no murmur RESP-CTAB ABD-NABS,soft,NT,ND EXT- No edema Pulses- Radial  2+        Assessment & Plan:      Problem List Items Addressed This Visit    Sinus tachycardia    Currently controlled with Toprol she's not had any symptomatic  episodes.      Protein-calorie malnutrition ([place])    Also discussed increasing her insurance to twice a day or evening using [employer]. She declines any change in her diet otherwise. She is at least keeping her weight steady.      Gait instability - Primary    Discussed using the cane especially in the house and she should use her walker when she leaves to go out. She importantly can be very strong headed and does not like to use any of these dialysis with her gait.      Constipation    Discussed use of MiraLAX as needed for constipation         Note: This dictation was prepared with Dragon dictation along with smaller phrase technology. Any transcriptional errors that result from this process are unintentional.

## 2017-03-16 ENCOUNTER — Ambulatory Visit: Payer: Medicare HMO | Admitting: Podiatry

## 2017-03-30 DIAGNOSIS — R69 Illness, unspecified: Secondary | ICD-10-CM | POA: Diagnosis not present

## 2017-05-18 ENCOUNTER — Ambulatory Visit (INDEPENDENT_AMBULATORY_CARE_PROVIDER_SITE_OTHER): Payer: Medicare HMO | Admitting: Podiatry

## 2017-05-18 ENCOUNTER — Encounter: Payer: Self-pay | Admitting: Podiatry

## 2017-05-18 DIAGNOSIS — B351 Tinea unguium: Secondary | ICD-10-CM

## 2017-05-18 DIAGNOSIS — M79676 Pain in unspecified toe(s): Secondary | ICD-10-CM | POA: Diagnosis not present

## 2017-05-20 NOTE — Progress Notes (Signed)
   SUBJECTIVE Patient  presents to office today complaining of elongated, thickened nails. Pain while ambulating in shoes. Patient is unable to trim their own nails.   Past Medical History:  Diagnosis Date  . Arthritis   . Dysphagia   . Fall   . Hypertension   . Osteoporosis     OBJECTIVE General Patient is awake, alert, and oriented x 3 and in no acute distress. Derm Skin is dry and supple bilateral. Negative open lesions or macerations. Remaining integument unremarkable. Nails are tender, long, thickened and dystrophic with subungual debris, consistent with onychomycosis, 1-5 bilateral. No signs of infection noted. Vasc  DP and PT pedal pulses palpable bilaterally. Temperature gradient within normal limits.  Neuro Epicritic and protective threshold sensation diminished bilaterally.  Musculoskeletal Exam No symptomatic pedal deformities noted bilateral. Muscular strength within normal limits.  ASSESSMENT 1. Onychodystrophic nails 1-5 bilateral with hyperkeratosis of nails.  2. Onychomycosis of nail due to dermatophyte bilateral 3. Pain in foot bilateral  PLAN OF CARE 1. Patient evaluated today.  2. Instructed to maintain good pedal hygiene and foot care.  3. Mechanical debridement of nails 1-5 bilaterally performed using a nail nipper. Filed with dremel without incident.  4. Return to clinic in 3 mos.    Edrick Kins, DPM [facility]  Dr. Edrick Kins, [address]                                        [place], [place] [address]                Office [phone]  Fax [phone]

## 2017-05-30 ENCOUNTER — Telehealth: Payer: Self-pay | Admitting: Family Medicine

## 2017-05-30 MED ORDER — METOPROLOL SUCCINATE ER 25 MG PO TB24: 12.5000 mg | ORAL_TABLET | Freq: Every day | ORAL | 1 refills | Status: DC

## 2017-05-30 NOTE — Telephone Encounter (Signed)
Medication refilled per protocol. 

## 2017-06-01 ENCOUNTER — Other Ambulatory Visit: Payer: Self-pay | Admitting: *Deleted

## 2017-06-01 MED ORDER — METOPROLOL SUCCINATE ER 25 MG PO TB24: 12.5000 mg | ORAL_TABLET | Freq: Every day | ORAL | 1 refills | Status: DC

## 2017-06-09 ENCOUNTER — Other Ambulatory Visit: Payer: Self-pay | Admitting: Family Medicine

## 2017-06-09 MED ORDER — METOPROLOL SUCCINATE ER 25 MG PO TB24: 12.5000 mg | ORAL_TABLET | Freq: Every day | ORAL | 1 refills | Status: DC

## 2017-06-14 ENCOUNTER — Encounter: Payer: Self-pay | Admitting: Family Medicine

## 2017-06-14 ENCOUNTER — Ambulatory Visit (INDEPENDENT_AMBULATORY_CARE_PROVIDER_SITE_OTHER): Payer: Medicare HMO | Admitting: Family Medicine

## 2017-06-14 VITALS — BP 112/68 | HR 80 | Temp 98.3°F | Resp 12 | Wt 80.0 lb

## 2017-06-14 DIAGNOSIS — L819 Disorder of pigmentation, unspecified: Secondary | ICD-10-CM

## 2017-06-14 DIAGNOSIS — D229 Melanocytic nevi, unspecified: Secondary | ICD-10-CM

## 2017-06-14 DIAGNOSIS — Z23 Encounter for immunization: Secondary | ICD-10-CM | POA: Diagnosis not present

## 2017-06-14 DIAGNOSIS — R000 Tachycardia, unspecified: Secondary | ICD-10-CM

## 2017-06-14 DIAGNOSIS — E440 Moderate protein-calorie malnutrition: Secondary | ICD-10-CM | POA: Diagnosis not present

## 2017-06-14 LAB — COMPREHENSIVE METABOLIC PANEL
AG RATIO: 1.5 (calc) (ref 1.0–2.5)
ALBUMIN MSPROF: 4 g/dL (ref 3.6–5.1)
ALT: 5 U/L — ABNORMAL LOW (ref 6–29)
AST: 23 U/L (ref 10–35)
Alkaline phosphatase (APISO): 61 U/L (ref 33–130)
BUN: 16 mg/dL (ref 7–25)
CO2: 27 mmol/L (ref 20–32)
CREATININE: 0.83 mg/dL (ref 0.60–0.88)
Calcium: 9.7 mg/dL (ref 8.6–10.4)
Chloride: 99 mmol/L (ref 98–110)
GLUCOSE: 106 mg/dL — AB (ref 65–99)
Globulin: 2.6 g/dL (calc) (ref 1.9–3.7)
POTASSIUM: 4.7 mmol/L (ref 3.5–5.3)
SODIUM: 135 mmol/L (ref 135–146)
TOTAL PROTEIN: 6.6 g/dL (ref 6.1–8.1)
Total Bilirubin: 0.8 mg/dL (ref 0.2–1.2)

## 2017-06-14 LAB — CBC WITH DIFFERENTIAL/PLATELET
BASOS PCT: 0.8 %
Basophils Absolute: 39 cells/uL (ref 0–200)
Eosinophils Absolute: 49 cells/uL (ref 15–500)
Eosinophils Relative: 1 %
HCT: 39.2 % (ref 35.0–45.0)
HEMOGLOBIN: 12.8 g/dL (ref 11.7–15.5)
Lymphs Abs: 828 cells/uL — ABNORMAL LOW (ref 850–3900)
MCH: 28.3 pg (ref 27.0–33.0)
MCHC: 32.7 g/dL (ref 32.0–36.0)
MCV: 86.7 fL (ref 80.0–100.0)
MONOS PCT: 6.5 %
MPV: 10.6 fL (ref 7.5–12.5)
NEUTROS ABS: 3665 {cells}/uL (ref 1500–7800)
Neutrophils Relative %: 74.8 %
Platelets: 303 10*3/uL (ref 140–400)
RBC: 4.52 10*6/uL (ref 3.80–5.10)
RDW: 12.2 % (ref 11.0–15.0)
Total Lymphocyte: 16.9 %
WBC mixed population: 319 cells/uL (ref 200–950)
WBC: 4.9 10*3/uL (ref 3.8–10.8)

## 2017-06-14 NOTE — Assessment & Plan Note (Signed)
Tolerating low dose  metoprolol

## 2017-06-14 NOTE — Patient Instructions (Addendum)
CANCEL November APPOINTMENT Referral to dermatology  F/U 6 months

## 2017-06-14 NOTE — Progress Notes (Signed)
   Subjective:    Patient ID: Gina Finley, female    DOB: 1918-02-03, 83 y.o.   MRN: [id number]  Patient presents for Lesion on right temple area (pt is fasting) and Flu Vaccine  Pt here for flu shot  Also has spot on her right temple which she states has been present for years but now growing rapidly and is sore and bleeding at times. Otherwise she feels fine.  Appetite is so-so but she is not losing any significant weight. She has not had any falls no recent illness. She is still tolerating her low dose of metoprolol for her tachycardic syndrome She will like her flu shot today  Medications reviewed       Review Of Systems: per abov   GEN- denies fatigue, fever, weight loss,weakness, recent illness HEENT- denies eye drainage, change in vision, nasal discharge, CVS- denies chest pain, palpitations RESP- denies SOB, cough, wheeze ABD- denies N/V, change in stools, abd pain GU- denies dysuria, hematuria, dribbling, incontinence MSK- denies joint pain, muscle aches, injury Neuro- denies headache, dizziness, syncope, seizure activity       Objective:    BP 112/68   Pulse 80   Temp 98.3 F (36.8 C) (Oral)   Resp 12   Wt 80 lb (36.3 kg)   SpO2 98%   BMI 16.57 kg/m  GEN- NAD, alert and oriented x3 HEENT- PERRL, EOMI, non injected sclera, pink conjunctiva, MMM, oropharynx clear Neck- Supple, no thyromegaly,no LAD CVS- RRR, no murmur RESP-CTAB EXT- No edema Skin- hyperpigmented silver dollar size lesion on right temple, edge closest to hairline with flaking, dry blood, Mild TTP, hair growing beneath lesion Pulses- Radial2+        Assessment & Plan:      Problem List Items Addressed This Visit      Unprioritized   Sinus tachycardia    Tolerating low dose  metoprolol      Relevant Orders   CBC with Differential/Platelet   Comprehensive metabolic panel   Protein-calorie malnutrition ([place])    In general she is maintaining her weight she's not had any falls no  illnesses doing okay at home  I'm concerned about growing lesion on the side of her face as it is a little friable and tender. We'll set her with dermatology which is family's preference. May need to be biopsied      Relevant Orders   CBC with Differential/Platelet   Comprehensive metabolic panel    Other Visit Diagnoses    Change in pigmented skin lesion of face    -  Primary   Relevant Orders   Ambulatory referral to Dermatology   Atypical nevi       Relevant Orders   Ambulatory referral to Dermatology   Needs flu shot       Relevant Orders   Flu Vaccine QUAD 36+ mos IM (Completed)      Note: This dictation was prepared with Dragon dictation along with smaller phrase technology. Any transcriptional errors that result from this process are unintentional.

## 2017-06-14 NOTE — Assessment & Plan Note (Signed)
In general she is maintaining her weight she's not had any falls no illnesses doing okay at home  I'm concerned about growing lesion on the side of her face as it is a little friable and tender. We'll set her with dermatology which is family's preference. May need to be biopsied

## 2017-06-20 ENCOUNTER — Encounter: Payer: Self-pay | Admitting: Family Medicine

## 2017-06-29 DIAGNOSIS — L720 Epidermal cyst: Secondary | ICD-10-CM | POA: Diagnosis not present

## 2017-06-29 DIAGNOSIS — D2239 Melanocytic nevi of other parts of face: Secondary | ICD-10-CM | POA: Diagnosis not present

## 2017-06-29 DIAGNOSIS — L821 Other seborrheic keratosis: Secondary | ICD-10-CM | POA: Diagnosis not present

## 2017-08-01 ENCOUNTER — Ambulatory Visit: Payer: Medicare HMO | Admitting: Family Medicine

## 2017-08-17 ENCOUNTER — Ambulatory Visit: Payer: Medicare HMO | Admitting: Podiatry

## 2017-08-25 ENCOUNTER — Encounter (HOSPITAL_COMMUNITY): Payer: Self-pay | Admitting: Emergency Medicine

## 2017-08-25 ENCOUNTER — Emergency Department (HOSPITAL_COMMUNITY)
Admission: EM | Admit: 2017-08-25 | Discharge: 2017-08-25 | Disposition: A | Discharge status: Home / Self Care | Payer: Medicare HMO | Attending: Physician Assistant | Admitting: Physician Assistant

## 2017-08-25 ENCOUNTER — Emergency Department (HOSPITAL_COMMUNITY): Payer: Medicare HMO

## 2017-08-25 DIAGNOSIS — Y998 Other external cause status: Secondary | ICD-10-CM | POA: Insufficient documentation

## 2017-08-25 DIAGNOSIS — Z79899 Other long term (current) drug therapy: Secondary | ICD-10-CM | POA: Diagnosis not present

## 2017-08-25 DIAGNOSIS — S22000A Wedge compression fracture of unspecified thoracic vertebra, initial encounter for closed fracture: Secondary | ICD-10-CM | POA: Diagnosis not present

## 2017-08-25 DIAGNOSIS — M4854XA Collapsed vertebra, not elsewhere classified, thoracic region, initial encounter for fracture: Secondary | ICD-10-CM

## 2017-08-25 DIAGNOSIS — I10 Essential (primary) hypertension: Secondary | ICD-10-CM | POA: Diagnosis not present

## 2017-08-25 DIAGNOSIS — Y33XXXA Other specified events, undetermined intent, initial encounter: Secondary | ICD-10-CM | POA: Insufficient documentation

## 2017-08-25 DIAGNOSIS — Y92513 Shop (commercial) as the place of occurrence of the external cause: Secondary | ICD-10-CM | POA: Diagnosis not present

## 2017-08-25 DIAGNOSIS — Y9389 Activity, other specified: Secondary | ICD-10-CM | POA: Diagnosis not present

## 2017-08-25 DIAGNOSIS — S299XXA Unspecified injury of thorax, initial encounter: Secondary | ICD-10-CM | POA: Diagnosis present

## 2017-08-25 DIAGNOSIS — S22040A Wedge compression fracture of fourth thoracic vertebra, initial encounter for closed fracture: Secondary | ICD-10-CM | POA: Insufficient documentation

## 2017-08-25 MED ORDER — ACETAMINOPHEN 80 MG PO CHEW
500.0000 mg | CHEWABLE_TABLET | Freq: Once | ORAL | Status: DC
  Filled 2017-08-25: qty 7

## 2017-08-25 MED ORDER — ACETAMINOPHEN 80 MG PO CHEW: 500.0000 mg | CHEWABLE_TABLET | Freq: Four times a day (QID) | ORAL | 0 refills | Status: DC | PRN

## 2017-08-25 MED ORDER — ACETAMINOPHEN 80 MG PO CHEW
520.0000 mg | CHEWABLE_TABLET | Freq: Once | ORAL | Status: AC
  Administered 2017-08-25: 520 mg via ORAL
  Filled 2017-08-25: qty 7

## 2017-08-25 MED ORDER — ACETAMINOPHEN 500 MG PO TABS: 500.0000 mg | ORAL_TABLET | Freq: Once | ORAL | Status: DC

## 2017-08-25 MED ORDER — LIDOCAINE 5 % EX PTCH
1.0000 | MEDICATED_PATCH | CUTANEOUS | Status: DC
  Administered 2017-08-25: 1 via TRANSDERMAL
  Filled 2017-08-25: qty 1

## 2017-08-25 NOTE — ED Provider Notes (Signed)
[facility] EMERGENCY DEPARTMENT Provider Note   CSN: [id number] Arrival date & time: 08/25/17  1017     History   Chief Complaint Chief Complaint  Patient presents with  . Back Pain    HPI Gina Finley is a 83 y.o. female.  HPI  Patient is a 83 year old female presenting with back pain.  Patient reports that she was shopping with her daughter.  Her daughter gives most the history.  They went to one store and she was picking some stuff up.  They went to the next store and she all of a sudden had pain in her thoracic spine.  They came here to be evaluated.  Patient did not have any trauma.  No numbness no tingling.  Just pain in her mid back.  Patient has extensive history of osteoporosis, and protein calorie malnutrition.  Past Medical History:  Diagnosis Date  . Arthritis   . Dysphagia   . Fall   . Hypertension   . Osteoporosis     Patient Active Problem List   Diagnosis Date Noted  . Protein-calorie malnutrition ([place]) 01/28/2017  . Constipation 01/28/2017  . Sinus tachycardia 09/29/2016  . Dysphagia 09/29/2016  . Osteoporosis 09/29/2016  . Gait instability 09/29/2016    Past Surgical History:  Procedure Laterality Date  . HIP SURGERY      OB History    No data available       Home Medications    Prior to Admission medications   Medication Sig Start Date End Date Taking? Authorizing Provider  aspirin 81 MG chewable tablet Chew 81 mg by mouth daily as needed for mild pain.   Yes [provider]  metoprolol succinate (TOPROL-XL) 25 MG 24 hr tablet Take 0.5 tablets (12.5 mg total) by mouth daily. 06/09/17  Yes Susy Frizzle, MD  Multiple Vitamins-Minerals (ONE-A-DAY 50 PLUS PO) Take 1 tablet by mouth daily.   Yes [provider]  Polyethyl Glycol-Propyl Glycol (SYSTANE OP) Apply 1 drop to eye daily.   Yes [provider]  acetaminophen (TYLENOL) 80 MG chewable tablet Chew 6.5 tablets (520 mg total) by mouth  every 6 (six) hours as needed. 08/25/17   Mackuen, Fredia Sorrow, MD    Family History No family history on file.  Social History Social History   Tobacco Use  . Smoking status: Never Smoker  . Smokeless tobacco: Never Used  Substance Use Topics  . Alcohol use: No  . Drug use: No     Allergies   Patient has no known allergies.   Review of Systems Review of Systems  Constitutional: Negative for activity change.  Respiratory: Negative for shortness of breath.   Cardiovascular: Negative for chest pain.  Gastrointestinal: Negative for abdominal pain.  Musculoskeletal: Positive for back pain.     Physical Exam Updated Vital Signs BP (!) 154/88 (BP Location: Right Arm)   Pulse 94   Temp 98.1 F (36.7 C) (Oral)   Resp 16   SpO2 97%   Physical Exam  Constitutional: She is oriented to person, place, and time. She appears well-developed and well-nourished.  HENT:  Head: Normocephalic and atraumatic.  Eyes: Right eye exhibits no discharge.  Cardiovascular: Normal rate, regular rhythm and normal heart sounds.  No murmur heard. Pulmonary/Chest: Effort normal and breath sounds normal. She has no wheezes. She has no rales.  Abdominal: Soft. She exhibits no distension. There is no tenderness.  Musculoskeletal: She exhibits no deformity.  Kyphotic back.  Patient is real  thin, all of her bones are protruding.  Patient has tenderness in the mid T-spine.  Neurological: She is oriented to person, place, and time.  Skin: Skin is warm and dry. She is not diaphoretic.  Psychiatric: She has a normal mood and affect.  Nursing note and vitals reviewed.    ED Treatments / Results  Labs (all labs ordered are listed, but only abnormal results are displayed) Labs Reviewed - No data to display  EKG  EKG Interpretation None       Radiology Dg Chest 2 View  Result Date: 08/25/2017 CLINICAL DATA:  Back pain EXAM: CHEST  2 VIEW COMPARISON:  06/09/2016 FINDINGS: The lungs are  hyperinflated likely secondary to COPD. There is no focal parenchymal opacity. There is no pleural effusion or pneumothorax. There is stable cardiomegaly. There is a large hiatal hernia. There is generalized osteopenia. There is a midthoracic spine compression fracture of indeterminate age. There is a chronic lower thoracic spine compression fracture. IMPRESSION: No active cardiopulmonary disease. Midthoracic spine compression fracture of indeterminate age. Electronically Signed   By: Kathreen Devoid   On: 08/25/2017 12:32   Dg Thoracic Spine 2 View  Result Date: 08/25/2017 CLINICAL DATA:  Acute onset severe thoracic spine pain extending into the left chest after the patient bent over to pick something up 5 days ago. Initial encounter. EXAM: THORACIC SPINE 2 VIEWS COMPARISON:  PA and lateral chest 10/30/2015. FINDINGS: Convex left scoliosis is again seen. The patient has unchanged T8 and T12 compression fractures. There is a T4 superior endplate compression fracture which appears to be new since the prior plain films but is age indeterminate. No other fracture is identified. Bones are osteopenic. IMPRESSION: T4 compression fracture is new since the comparison chest films but remains age indeterminate. Remote T8 and T12 compression fractures are unchanged. Osteopenia. Electronically Signed   By: Inge Rise M.D.   On: 08/25/2017 12:32    Procedures Procedures (including critical care time)  Medications Ordered in ED Medications  lidocaine (LIDODERM) 5 % 1 patch (1 patch Transdermal Patch Applied 08/25/17 1247)  acetaminophen (TYLENOL) chewable tablet 520 mg (520 mg Oral Given 08/25/17 1248)     Initial Impression / Assessment and Plan / ED Course  I have reviewed the triage vital signs and the nursing notes.  Pertinent labs & imaging results that were available during my care of the patient were reviewed by me and considered in my medical decision making (see chart for details).     Patient  is a 83 year old female presenting with back pain.  Patient reports that she was shopping with her daughter.  Her daughter gives most the history.  They went to one store and she was picking some stuff up.  They went to the next store and she all of a sudden had pain in her thoracic spine.  They came here to be evaluated.  Patient did not have any trauma.  No numbness no tingling.  Just pain in her mid back.  Patient has extensive history of osteoporosis, and protein calorie malnutrition.  3:11 PM Patient has thoracic spine fracture.  Given patient is 83 years old, I do not think she is a good surgical candidate.  Did talk about the idea of kyphoplasty and daughter seemed interested, we will give her follow-up with neurosurgery.    Final Clinical Impressions(s) / ED Diagnoses   Final diagnoses:  Non-traumatic compression fracture of fourth thoracic vertebra, initial encounter [facility])    ED Discharge Orders  Ordered    acetaminophen (TYLENOL) 80 MG chewable tablet  Every 6 hours PRN     08/25/17 1424       Mackuen, Courteney Lyn, MD 08/25/17 1511

## 2017-08-25 NOTE — ED Notes (Signed)
Pt verbalized understanding of d/c instructions and has no further questions. VSS. NAD.  

## 2017-08-25 NOTE — ED Triage Notes (Signed)
Pt states she was christmas shopping on Tuesday and picked something heavy up to place in her cart and since then has been having upper mid back pain. Pt has history of arthritis and osteoporosis.

## 2017-08-25 NOTE — ED Notes (Signed)
Pt taken to xray 

## 2017-08-25 NOTE — Discharge Instructions (Signed)
You were found to have an atraumatic fracture of your spine.  Sometimes physicians will do what is called a kyphoplasty.  Please call this neurosurgery office if you would like to pursue that option.  Otherwise please use lidocaine patches and chewable Tylenol to help with the pain.  Please follow-up with your primary care physician with them know what is going on.  You can use Salon [place] lidocaine pathces to help with symptoms.

## 2017-09-21 ENCOUNTER — Other Ambulatory Visit: Payer: Self-pay

## 2017-09-21 ENCOUNTER — Ambulatory Visit (INDEPENDENT_AMBULATORY_CARE_PROVIDER_SITE_OTHER): Payer: Medicare HMO | Admitting: Family Medicine

## 2017-09-21 ENCOUNTER — Encounter: Payer: Self-pay | Admitting: Family Medicine

## 2017-09-21 VITALS — BP 118/70 | HR 102 | Temp 98.2°F | Resp 16 | Ht 58.25 in | Wt 77.8 lb

## 2017-09-21 DIAGNOSIS — E440 Moderate protein-calorie malnutrition: Secondary | ICD-10-CM

## 2017-09-21 DIAGNOSIS — R000 Tachycardia, unspecified: Secondary | ICD-10-CM | POA: Diagnosis not present

## 2017-09-21 DIAGNOSIS — S22000G Wedge compression fracture of unspecified thoracic vertebra, subsequent encounter for fracture with delayed healing: Secondary | ICD-10-CM

## 2017-09-21 NOTE — Progress Notes (Signed)
   Subjective:    Patient ID: Doniesha Landau, female    DOB: 01-26-1918, 84 y.o.   MRN: [id number]  Patient presents for ER F/U (throacic vertebrae fracture)  Pt here for ER follow up, seen in ED recently after she was shopping picked up a heavy box and had severe pain in thoracic region, xray showed thoracic compression fracture.  Given Tyneol 750mg  in the morning and 1000mg  in evening pain is now much improved. Able to breathe wihout pain, when she moves around still gets some discomfort but much improved. Her appetite decreased right after injury as well, but started her boost back and ate better past 2 days.     Review Of Systems:  GEN- denies fatigue, fever, weight loss,weakness, recent illness HEENT- denies eye drainage, change in vision, nasal discharge, CVS- denies chest pain, palpitations RESP- denies SOB, cough, wheeze ABD- denies N/V, change in stools, abd pain GU- denies dysuria, hematuria, dribbling, incontinence MSK- + joint pain, muscle aches, injury Neuro- denies headache, dizziness, syncope, seizure activity       Objective:    BP 118/70   Pulse (!) 102   Temp 98.2 F (36.8 C) (Oral)   Resp 16   Ht 4' 10.25" (1.48 m)   Wt 77 lb 12.8 oz (35.3 kg)   SpO2 96%   BMI 16.12 kg/m  GEN- NAD, alert and oriented x3,frail  HEENT- PERRL, EOMI, non injected sclera, pink conjunctiva, MMM, oropharynx clear CVS- RRR, no murmur HR 90 RESP-CTAB ABD-NABS,soft,NT,ND MSK- kyphosios, Mild TTP thoracic region, decreased ROM SPINE, HIPS, KNEES, waking with walker  Skin- in tact EXT- No edema Pulses- Radial  2+        Assessment & Plan:      Problem List Items Addressed This Visit      Unprioritized   Sinus tachycardia - Primary    fairly well controlled with metoprolol, BP maintained When she exerts herself rate goes up some, also using walker today  No change to dose       Protein-calorie malnutrition ([place])    Long term malnutrition for her age Now back on  boost Eats small meals Considered remeron but due to probable side effects decided against this       Other Visit Diagnoses    Closed compression fracture of thoracic vertebra with delayed healing, subsequent encounter       Discussed kyphoplasty procedure, since she is improving and this is invasive procedure decided to hold off, use tylenol, vitamin D If pain starts to worsen then will reconsider      Note: This dictation was prepared with Dragon dictation along with smaller phrase technology. Any transcriptional errors that result from this process are unintentional.

## 2017-09-21 NOTE — Assessment & Plan Note (Signed)
fairly well controlled with metoprolol, BP maintained When she exerts herself rate goes up some, also using walker today  No change to dose

## 2017-09-21 NOTE — Patient Instructions (Addendum)
Chewable  vitamin D  Use the boost F/U as previous

## 2017-09-21 NOTE — Assessment & Plan Note (Addendum)
Long term malnutrition for her age Now back on boost Eats small meals Considered remeron but due to probable side effects decided against this

## 2017-11-09 ENCOUNTER — Other Ambulatory Visit: Payer: Self-pay | Admitting: *Deleted

## 2017-11-09 MED ORDER — METOPROLOL SUCCINATE ER 25 MG PO TB24: 12.5000 mg | ORAL_TABLET | Freq: Every day | ORAL | 1 refills | Status: AC

## 2017-11-16 ENCOUNTER — Ambulatory Visit: Payer: Medicare HMO | Admitting: Podiatry

## 2017-11-16 DIAGNOSIS — B351 Tinea unguium: Secondary | ICD-10-CM | POA: Diagnosis not present

## 2017-11-16 DIAGNOSIS — M79676 Pain in unspecified toe(s): Secondary | ICD-10-CM | POA: Diagnosis not present

## 2017-11-17 NOTE — Progress Notes (Signed)
   SUBJECTIVE Patient presents to office today complaining of elongated, thickened nails that cause pain while ambulating in shoes. She is unable to trim her own nails. Patient is here for further evaluation and treatment.  Past Medical History:  Diagnosis Date  . Arthritis   . Dysphagia   . Fall   . Hypertension   . Osteoporosis     OBJECTIVE General Patient is awake, alert, and oriented x 3 and in no acute distress. Derm Skin is dry and supple bilateral. Negative open lesions or macerations. Remaining integument unremarkable. Nails are tender, long, thickened and dystrophic with subungual debris, consistent with onychomycosis, 1-5 bilateral. No signs of infection noted. Vasc  DP and PT pedal pulses palpable bilaterally. Temperature gradient within normal limits.  Neuro Epicritic and protective threshold sensation diminished bilaterally.  Musculoskeletal Exam No symptomatic pedal deformities noted bilateral. Muscular strength within normal limits.  ASSESSMENT 1. Onychodystrophic nails 1-5 bilateral with hyperkeratosis of nails.  2. Onychomycosis of nail due to dermatophyte bilateral 3. Pain in foot bilateral  PLAN OF CARE 1. Patient evaluated today.  2. Instructed to maintain good pedal hygiene and foot care.  3. Mechanical debridement of nails 1-5 bilaterally performed using a nail nipper. Filed with dremel without incident.  4. Silicone toe caps dispensed.  5. Return to clinic in 3 mos.    Edrick Kins, DPM [facility]  Dr. Edrick Kins, [address]                                        [place],  [address]                Office [phone]  Fax [phone]

## 2017-12-13 ENCOUNTER — Encounter: Payer: Self-pay | Admitting: Family Medicine

## 2017-12-13 ENCOUNTER — Ambulatory Visit (INDEPENDENT_AMBULATORY_CARE_PROVIDER_SITE_OTHER): Payer: Medicare HMO | Admitting: Family Medicine

## 2017-12-13 ENCOUNTER — Other Ambulatory Visit: Payer: Self-pay

## 2017-12-13 VITALS — BP 128/68 | HR 62 | Temp 98.1°F | Resp 16 | Ht 58.25 in | Wt 77.4 lb

## 2017-12-13 DIAGNOSIS — R000 Tachycardia, unspecified: Secondary | ICD-10-CM

## 2017-12-13 DIAGNOSIS — M8000XD Age-related osteoporosis with current pathological fracture, unspecified site, subsequent encounter for fracture with routine healing: Secondary | ICD-10-CM

## 2017-12-13 DIAGNOSIS — L821 Other seborrheic keratosis: Secondary | ICD-10-CM

## 2017-12-13 DIAGNOSIS — S20212A Contusion of left front wall of thorax, initial encounter: Secondary | ICD-10-CM

## 2017-12-13 DIAGNOSIS — E440 Moderate protein-calorie malnutrition: Secondary | ICD-10-CM | POA: Diagnosis not present

## 2017-12-13 LAB — CBC WITH DIFFERENTIAL/PLATELET
BASOS PCT: 0.6 %
Basophils Absolute: 40 cells/uL (ref 0–200)
EOS ABS: 40 {cells}/uL (ref 15–500)
Eosinophils Relative: 0.6 %
HCT: 40.4 % (ref 35.0–45.0)
HEMOGLOBIN: 13.6 g/dL (ref 11.7–15.5)
Lymphs Abs: 766 cells/uL — ABNORMAL LOW (ref 850–3900)
MCH: 29.3 pg (ref 27.0–33.0)
MCHC: 33.7 g/dL (ref 32.0–36.0)
MCV: 87.1 fL (ref 80.0–100.0)
MPV: 11.2 fL (ref 7.5–12.5)
Monocytes Relative: 6.7 %
NEUTROS ABS: 5313 {cells}/uL (ref 1500–7800)
Neutrophils Relative %: 80.5 %
Platelets: 317 10*3/uL (ref 140–400)
RBC: 4.64 10*6/uL (ref 3.80–5.10)
RDW: 11.7 % (ref 11.0–15.0)
TOTAL LYMPHOCYTE: 11.6 %
WBC: 6.6 10*3/uL (ref 3.8–10.8)
WBCMIX: 442 {cells}/uL (ref 200–950)

## 2017-12-13 LAB — COMPREHENSIVE METABOLIC PANEL
AG RATIO: 1.8 (calc) (ref 1.0–2.5)
ALKALINE PHOSPHATASE (APISO): 73 U/L (ref 33–130)
ALT: 5 U/L — ABNORMAL LOW (ref 6–29)
AST: 24 U/L (ref 10–35)
Albumin: 4.3 g/dL (ref 3.6–5.1)
BILIRUBIN TOTAL: 0.6 mg/dL (ref 0.2–1.2)
BUN: 17 mg/dL (ref 7–25)
CALCIUM: 10 mg/dL (ref 8.6–10.4)
CHLORIDE: 100 mmol/L (ref 98–110)
CO2: 26 mmol/L (ref 20–32)
Creat: 0.88 mg/dL (ref 0.60–0.88)
GLOBULIN: 2.4 g/dL (ref 1.9–3.7)
Glucose, Bld: 111 mg/dL — ABNORMAL HIGH (ref 65–99)
Potassium: 4.8 mmol/L (ref 3.5–5.3)
Sodium: 135 mmol/L (ref 135–146)
Total Protein: 6.7 g/dL (ref 6.1–8.1)

## 2017-12-13 NOTE — Patient Instructions (Addendum)
Dermatology appointment  We will call with lab results  F/U 6 months

## 2017-12-13 NOTE — Assessment & Plan Note (Signed)
Heart rate is controlled with a low-dose of beta-blocker

## 2017-12-13 NOTE — Progress Notes (Signed)
Subjective:    Patient ID: Gina Finley, female    DOB: [birth year], 84 y.o.   MRN: [id number]  Patient presents for Follow-up (is fasting)  Pt here to f/u chronic meidcal problems   Overall doing well  Maintaining her weight her appetite is still up and down she does still drink boost She did have a fall they think a couple months ago.  He is a bit confusing but it seems like she was getting up in the middle the night trying to get her walker and she fell over into the walker she has some bruising on the right side of her leg with pain in her left groin and inner left thigh she did not walk for a couple of weeks secondary to pain.  She is also had some rib discomfort on the left side but she does not recall if she actually hit that side or not.  She is now able to walk and move around back to her baseline.  She still has some soreness in the wrist.  She also gets some back pain on and off she has known compression fracture from a few months ago.  She has not actually been taking the Tylenol.  She has had some bleeding and soreness to the large seborrheic keratosis on her right temple.  They called dermatology to see if they would do something with this however was run the same time where she could move around to the point was canceled.  She has been using Neosporin.  She does admit to picking at it at times.   Review Of Systems:  GEN- denies fatigue, fever, weight loss,weakness, recent illness HEENT- denies eye drainage, change in vision, nasal discharge, CVS- denies chest pain, palpitations RESP- denies SOB, cough, wheeze ABD- denies N/V, change in stools, abd pain GU- denies dysuria, hematuria, dribbling, incontinence MSK- +joint pain, muscle aches, injury Neuro- denies headache, dizziness, syncope, seizure activity       Objective:    BP 128/68   Pulse 62   Temp 98.1 F (36.7 C) (Oral)   Resp 16   Ht 4' 10.25" (1.48 m)   Wt 77 lb 6.4 oz (35.1 kg)   SpO2 96%   BMI 16.04  kg/m  GEN- NAD, alert and oriented x3,frail  HEENT- PERRL, EOMI, non injected sclera, pink conjunctiva, MMM, oropharynx clear CVS- RRR, no murmur RESP-CTAB ABD-NABS,soft,NT,ND MSK- kyphosios, TTP left post upper ribs, no step off no bruising , decreased ROM SPINE, HIPS, KNEES, waking with walker  Skin- in tact EXT- No edema Pulses- Radial  2+       Assessment & Plan:      Problem List Items Addressed This Visit      Unprioritized   Osteoporosis   Sinus tachycardia - Primary    Heart rate is controlled with a low-dose of beta-blocker      Relevant Orders   CBC with Differential/Platelet   Comprehensive metabolic panel   Seborrheic keratoses    Inflamed seborrheic keratoses.  Discussed not picking at the area.  We call from our office to schedule her another appointment with a dermatologist      Protein-calorie malnutrition [facility])    In general she is maintaining her weight      Relevant Orders   CBC with Differential/Platelet   Comprehensive metabolic panel    Other Visit Diagnoses    Bruised rib, left, initial encounter       possible fracture occured, but now months out, definitely  has some bruising, as this would not change managment, good air movement sat normal, tylenol for pain      Note: This dictation was prepared with Dragon dictation along with smaller phrase technology. Any transcriptional errors that result from this process are unintentional.

## 2017-12-13 NOTE — Assessment & Plan Note (Signed)
Inflamed seborrheic keratoses.  Discussed not picking at the area.  We call from our office to schedule her another appointment with a dermatologist

## 2017-12-13 NOTE — Assessment & Plan Note (Signed)
In general she is maintaining her weight

## 2017-12-14 ENCOUNTER — Encounter: Payer: Self-pay | Admitting: *Deleted

## 2018-01-03 DIAGNOSIS — L821 Other seborrheic keratosis: Secondary | ICD-10-CM | POA: Diagnosis not present

## 2018-01-03 DIAGNOSIS — L820 Inflamed seborrheic keratosis: Secondary | ICD-10-CM | POA: Diagnosis not present

## 2018-01-03 DIAGNOSIS — L720 Epidermal cyst: Secondary | ICD-10-CM | POA: Diagnosis not present

## 2018-03-22 ENCOUNTER — Ambulatory Visit: Payer: Medicare HMO | Admitting: Podiatry

## 2018-04-24 ENCOUNTER — Ambulatory Visit: Payer: Medicare HMO | Admitting: Podiatry

## 2018-05-05 ENCOUNTER — Ambulatory Visit: Payer: Medicare HMO | Admitting: Family Medicine

## 2018-05-07 ENCOUNTER — Emergency Department (HOSPITAL_COMMUNITY): Payer: Medicare HMO

## 2018-05-07 ENCOUNTER — Other Ambulatory Visit: Payer: Self-pay

## 2018-05-07 ENCOUNTER — Observation Stay (HOSPITAL_COMMUNITY)
Admission: EM | Admit: 2018-05-07 | Discharge: 2018-05-09 | Disposition: A | Discharge status: Home / Self Care | Payer: Medicare HMO | Attending: Internal Medicine | Admitting: Internal Medicine

## 2018-05-07 ENCOUNTER — Encounter (HOSPITAL_COMMUNITY): Payer: Self-pay | Admitting: *Deleted

## 2018-05-07 DIAGNOSIS — E871 Hypo-osmolality and hyponatremia: Secondary | ICD-10-CM | POA: Diagnosis not present

## 2018-05-07 DIAGNOSIS — I10 Essential (primary) hypertension: Secondary | ICD-10-CM | POA: Diagnosis not present

## 2018-05-07 DIAGNOSIS — R000 Tachycardia, unspecified: Secondary | ICD-10-CM | POA: Diagnosis not present

## 2018-05-07 DIAGNOSIS — J9601 Acute respiratory failure with hypoxia: Secondary | ICD-10-CM | POA: Diagnosis not present

## 2018-05-07 DIAGNOSIS — R778 Other specified abnormalities of plasma proteins: Secondary | ICD-10-CM | POA: Diagnosis present

## 2018-05-07 DIAGNOSIS — R109 Unspecified abdominal pain: Principal | ICD-10-CM | POA: Diagnosis present

## 2018-05-07 DIAGNOSIS — R0600 Dyspnea, unspecified: Secondary | ICD-10-CM | POA: Diagnosis not present

## 2018-05-07 DIAGNOSIS — D259 Leiomyoma of uterus, unspecified: Secondary | ICD-10-CM | POA: Diagnosis not present

## 2018-05-07 DIAGNOSIS — R112 Nausea with vomiting, unspecified: Secondary | ICD-10-CM | POA: Diagnosis present

## 2018-05-07 DIAGNOSIS — R7989 Other specified abnormal findings of blood chemistry: Secondary | ICD-10-CM | POA: Diagnosis present

## 2018-05-07 DIAGNOSIS — K5900 Constipation, unspecified: Secondary | ICD-10-CM

## 2018-05-07 DIAGNOSIS — R739 Hyperglycemia, unspecified: Secondary | ICD-10-CM | POA: Diagnosis not present

## 2018-05-07 DIAGNOSIS — J189 Pneumonia, unspecified organism: Secondary | ICD-10-CM | POA: Diagnosis not present

## 2018-05-07 LAB — URINALYSIS, ROUTINE W REFLEX MICROSCOPIC
BILIRUBIN URINE: NEGATIVE
GLUCOSE, UA: NEGATIVE mg/dL
Hgb urine dipstick: NEGATIVE
KETONES UR: 5 mg/dL — AB
Leukocytes, UA: NEGATIVE
NITRITE: NEGATIVE
PH: 6 (ref 5.0–8.0)
Protein, ur: NEGATIVE mg/dL
Specific Gravity, Urine: 1.009 (ref 1.005–1.030)

## 2018-05-07 LAB — CBC WITH DIFFERENTIAL/PLATELET
BASOS PCT: 0 %
Basophils Absolute: 0 10*3/uL (ref 0.0–0.1)
EOS ABS: 0 10*3/uL (ref 0.0–0.7)
Eosinophils Relative: 1 %
HCT: 38.7 % (ref 36.0–46.0)
Hemoglobin: 12.9 g/dL (ref 12.0–15.0)
Lymphocytes Relative: 13 %
Lymphs Abs: 0.8 10*3/uL (ref 0.7–4.0)
MCH: 29.5 pg (ref 26.0–34.0)
MCHC: 33.3 g/dL (ref 30.0–36.0)
MCV: 88.6 fL (ref 78.0–100.0)
MONO ABS: 0.7 10*3/uL (ref 0.1–1.0)
MONOS PCT: 11 %
Neutro Abs: 4.6 10*3/uL (ref 1.7–7.7)
Neutrophils Relative %: 75 %
Platelets: 279 10*3/uL (ref 150–400)
RBC: 4.37 MIL/uL (ref 3.87–5.11)
RDW: 13.1 % (ref 11.5–15.5)
WBC: 6.2 10*3/uL (ref 4.0–10.5)

## 2018-05-07 LAB — COMPREHENSIVE METABOLIC PANEL
ALT: 9 U/L (ref 0–44)
ANION GAP: 9 (ref 5–15)
AST: 27 U/L (ref 15–41)
Albumin: 3.6 g/dL (ref 3.5–5.0)
Alkaline Phosphatase: 86 U/L (ref 38–126)
BILIRUBIN TOTAL: 1 mg/dL (ref 0.3–1.2)
BUN: 19 mg/dL (ref 8–23)
CHLORIDE: 96 mmol/L — AB (ref 98–111)
CO2: 27 mmol/L (ref 22–32)
Calcium: 9.5 mg/dL (ref 8.9–10.3)
Creatinine, Ser: 0.65 mg/dL (ref 0.44–1.00)
GFR calc Af Amer: >60 mL/min (ref >60)
GFR calc non Af Amer: >60 mL/min (ref >60)
Glucose, Bld: 143 mg/dL — ABNORMAL HIGH (ref 70–99)
POTASSIUM: 4.2 mmol/L (ref 3.5–5.1)
SODIUM: 132 mmol/L — AB (ref 135–145)
TOTAL PROTEIN: 6.8 g/dL (ref 6.5–8.1)

## 2018-05-07 LAB — LIPASE, BLOOD: LIPASE: 56 U/L — AB (ref 11–51)

## 2018-05-07 MED ORDER — FENTANYL CITRATE (PF) 100 MCG/2ML IJ SOLN
25.0000 ug | Freq: Once | INTRAMUSCULAR | Status: AC
  Administered 2018-05-07: 25 ug via INTRAVENOUS
  Filled 2018-05-07: qty 2

## 2018-05-07 MED ORDER — ONDANSETRON HCL 4 MG/2ML IJ SOLN
4.0000 mg | Freq: Once | INTRAMUSCULAR | Status: AC
  Administered 2018-05-07: 4 mg via INTRAVENOUS
  Filled 2018-05-07: qty 2

## 2018-05-07 MED ORDER — SODIUM CHLORIDE 0.9 % IV BOLUS
500.0000 mL | Freq: Once | INTRAVENOUS | Status: AC
  Administered 2018-05-07: 500 mL via INTRAVENOUS

## 2018-05-07 NOTE — ED Triage Notes (Signed)
Pt c/o lower right abd pain, lower back pain with n/v, pt states that the back pain started last week, lower abd pain that started yesterday, pt also c/o constipation, reports last bowel movement was two weeks ago,

## 2018-05-07 NOTE — ED Provider Notes (Signed)
[facility] EMERGENCY DEPARTMENT Provider Note   CSN: [id number] Arrival date & time: 05/07/18  2141     History   Chief Complaint Chief Complaint  Patient presents with  . Abdominal Pain    HPI Gina Finley is a 84 y.o. female.  HPI Patient presents with 2 days of lower abdominal pain mostly located in the right side.  She is had nausea and 2 episodes of vomiting.  Has not had a bowel movement in "sometime".  Does endorse fever and chills.  Most history is given by the patient's daughter.  No urinary symptoms.  Patient has low back pain for the past week.  No focal weakness or numbness.  History of prior compression fracture. Past Medical History:  Diagnosis Date  . Arthritis   . Dysphagia   . Fall   . Hypertension   . Osteoporosis     Patient Active Problem List   Diagnosis Date Noted  . Abdominal pain 05/08/2018  . Elevated troponin 05/08/2018  . Hypertension 05/08/2018  . Hyponatremia 05/08/2018  . Hyperglycemia 05/08/2018  . Nausea and vomiting 05/08/2018  . Dyspnea 05/08/2018  . Seborrheic keratoses 12/13/2017  . Protein-calorie malnutrition ([place]) 01/28/2017  . Constipation 01/28/2017  . Sinus tachycardia 09/29/2016  . Dysphagia 09/29/2016  . Osteoporosis 09/29/2016  . Gait instability 09/29/2016    Past Surgical History:  Procedure Laterality Date  . HIP SURGERY       OB History   None      Home Medications    Prior to Admission medications   Medication Sig Start Date End Date Taking? Authorizing Provider  metoprolol succinate (TOPROL-XL) 25 MG 24 hr tablet Take 0.5 tablets (12.5 mg total) by mouth daily. 11/09/17  Yes [place], Modena Nunnery, MD  Polyethyl Glycol-Propyl Glycol (SYSTANE OP) Apply 1 drop to eye daily.   Yes [provider]  HYDROcodone-acetaminophen (NORCO/VICODIN) 5-325 MG tablet Take 1 tablet by mouth every 4 (four) hours as needed for up to 5 days for moderate pain. 05/09/18 05/14/18  Heath Lark D, DO    Family History No  family history on file.  Social History Social History   Tobacco Use  . Smoking status: Never Smoker  . Smokeless tobacco: Never Used  Substance Use Topics  . Alcohol use: No  . Drug use: No     Allergies   Patient has no known allergies.   Review of Systems Review of Systems  Constitutional: Positive for appetite change, chills and fever.  HENT: Negative for trouble swallowing.   Eyes: Negative for visual disturbance.  Respiratory: Negative for cough and shortness of breath.   Cardiovascular: Negative for chest pain.  Gastrointestinal: Positive for abdominal pain, constipation, nausea and vomiting.  Genitourinary: Negative for dysuria, flank pain, frequency and hematuria.  Musculoskeletal: Positive for back pain. Negative for myalgias and neck pain.  Skin: Negative for rash and wound.  Neurological: Negative for dizziness, weakness, light-headedness, numbness and headaches.  All other systems reviewed and are negative.    Physical Exam Updated Vital Signs BP 135/73   Pulse 84   Temp 98.1 F (36.7 C) (Oral)   Resp 18   Ht 4\' 10"  (1.473 m)   Wt 34.2 kg   SpO2 91%   BMI 15.76 kg/m   Physical Exam  Constitutional: She is oriented to person, place, and time. She appears well-developed. No distress.  Cachectic appearing  HENT:  Head: Normocephalic and atraumatic.  Mouth/Throat: Oropharynx is clear and moist. No oropharyngeal exudate.  Eyes: Pupils are equal, round, and reactive to light. EOM are normal.  Neck: Normal range of motion. Neck supple. No JVD present.  Cardiovascular: Normal rate and regular rhythm. Exam reveals no gallop and no friction rub.  No murmur heard. Pulmonary/Chest: Effort normal and breath sounds normal.  Abdominal: Soft. Bowel sounds are normal. There is tenderness. There is no rebound and no guarding.  Tenderness to palpation of the right lower quadrant with minimal rebound tenderness.  Musculoskeletal: Normal range of motion. She  exhibits no edema or tenderness.  Severe kyphosis with diffuse thoracic and lumbar midline tenderness.  No lower extremity swelling or asymmetry.  Lymphadenopathy:    She has no cervical adenopathy.  Neurological: She is alert and oriented to person, place, and time.  Hard of hearing.  Moving all extremities without focal deficit.  Sensation is grossly intact.  Skin: Skin is warm and dry. Capillary refill takes less than 2 seconds. No rash noted. No erythema.  Psychiatric: She has a normal mood and affect. Her behavior is normal.  Nursing note and vitals reviewed.    ED Treatments / Results  Labs (all labs ordered are listed, but only abnormal results are displayed) Labs Reviewed  COMPREHENSIVE METABOLIC PANEL - Abnormal; Notable for the following components:      Result Value   Sodium 132 (*)    Chloride 96 (*)    Glucose, Bld 143 (*)    All other components within normal limits  LIPASE, BLOOD - Abnormal; Notable for the following components:   Lipase 56 (*)    All other components within normal limits  URINALYSIS, ROUTINE W REFLEX MICROSCOPIC - Abnormal; Notable for the following components:   Ketones, ur 5 (*)    All other components within normal limits  TROPONIN I - Abnormal; Notable for the following components:   Troponin I 0.03 (*)    All other components within normal limits  D-DIMER, QUANTITATIVE (NOT AT [facility]) - Abnormal; Notable for the following components:   D-Dimer, Quant 2.49 (*)    All other components within normal limits  BRAIN NATRIURETIC PEPTIDE - Abnormal; Notable for the following components:   B Natriuretic Peptide 105.0 (*)    All other components within normal limits  TROPONIN I - Abnormal; Notable for the following components:   Troponin I 0.03 (*)    All other components within normal limits  TROPONIN I - Abnormal; Notable for the following components:   Troponin I 0.03 (*)    All other components within normal limits  BASIC METABOLIC PANEL -  Abnormal; Notable for the following components:   Sodium 133 (*)    Calcium 8.3 (*)    All other components within normal limits  GLUCOSE, CAPILLARY - Abnormal; Notable for the following components:   Glucose-Capillary 102 (*)    All other components within normal limits  CBC WITH DIFFERENTIAL/PLATELET  MAGNESIUM  PHOSPHORUS  HEMOGLOBIN A1C  GLUCOSE, CAPILLARY  GLUCOSE, CAPILLARY  I-STAT CG4 LACTIC ACID, ED  POC OCCULT BLOOD, ED    EKG EKG Interpretation  Date/Time:  Monday May 08 2018 01:29:53 EDT Ventricular Rate:  101 PR Interval:    QRS Duration: 110 QT Interval:  356 QTC Calculation: 462 R Axis:   -152 Text Interpretation:  Sinus tachycardia Atrial premature complex Right ventricular hypertrophy Nonspecific ST abnormality No significant change was found Confirmed by Ezequiel Essex [phone]) on 05/08/2018 1:39:42 AM   Radiology Dg Chest 2 View  Result Date: 05/08/2018 CLINICAL DATA:  84 year old  female with shortness of breath EXAM: CHEST - 2 VIEW COMPARISON:  Prior chest x-ray 05/08/2018 FINDINGS: Similar appearance of the chest with stable cardiac and mediastinal contours. Large hiatal hernia again noted. Atherosclerotic calcifications throughout the abdominal aorta. Marked pulmonary hyperinflation with chronic bronchitic and interstitial changes consistent with underlying COPD. Severely exaggerated thoracic kyphosis with multiple chronic compression fractures. No definite acute osseous abnormality. No evidence of pneumothorax, pleural effusion or new airspace opacity. IMPRESSION: Stable chest x-ray without evidence of acute cardiopulmonary process. Electronically Signed   By: Jacqulynn Cadet M.D.   On: 05/08/2018 10:50   Nm Pulmonary Vent And Perf (v/q Scan)  Result Date: 05/08/2018 CLINICAL DATA:  Suspected pulmonary embolus. Complains of nausea, vomiting, abdominal pain after not having a bowel movement for the past 2 weeks. EXAM: NUCLEAR MEDICINE VENTILATION -  PERFUSION LUNG SCAN TECHNIQUE: Ventilation images were obtained in multiple projections using inhaled aerosol Tc-72m DTPA. Perfusion images were obtained in multiple projections after intravenous injection of Tc-49m-MAA. RADIOPHARMACEUTICALS:  33.0 mCi of Tc-67m DTPA aerosol inhalation and 4.4 mCi Tc48m-MAA IV COMPARISON:  Chest x-ray on 05/08/2018 FINDINGS: Ventilation: Patchy ventilation without large defect. Perfusion: Sub-segmental pleural-based defects without evidence for segmental or larger defect. IMPRESSION: Low probability for clinically significant pulmonary embolus. Electronically Signed   By: Nolon Nations M.D.   On: 05/08/2018 10:43    Procedures Procedures (including critical care time)  Medications Ordered in ED Medications  0.9 %  sodium chloride infusion ( Intravenous Stopping Infusion hung by another clincian 05/08/18 2153)  0.9 %  sodium chloride infusion ( Intravenous Transfusing/Transfer 05/08/18 1855)  sodium chloride 0.9 % bolus 500 mL (0 mLs Intravenous Stopped 05/07/18 2320)  fentaNYL (SUBLIMAZE) injection 25 mcg (25 mcg Intravenous Given 05/07/18 2215)  ondansetron (ZOFRAN) injection 4 mg (4 mg Intravenous Given 05/07/18 2215)  iopamidol (ISOVUE-300) 61 % injection 30 mL (30 mLs Oral Contrast Given 05/08/18 0015)  iopamidol (ISOVUE-300) 61 % injection 75 mL (75 mLs Intravenous Contrast Given 05/08/18 0016)  magnesium citrate solution 1 Bottle (1 Bottle Oral Given 05/08/18 0130)  cefTRIAXone (ROCEPHIN) 1 g in sodium chloride 0.9 % 100 mL IVPB (0 g Intravenous Stopped 05/08/18 0333)  azithromycin (ZITHROMAX) 250 mg in dextrose 5 % 125 mL IVPB (0 mg Intravenous Stopped 05/08/18 0551)  technetium TC 26M diethylenetriame-pentaacetic acid (DTPA) injection 33 millicurie (33 millicuries Inhalation Given 05/08/18 1000)  technetium albumin aggregated (MAA) injection solution 4 millicurie (4.4 millicuries Intravenous Contrast Given 05/08/18 1015)  loperamide (IMODIUM) capsule 4 mg (4 mg Oral Given  05/08/18 1649)  loperamide (IMODIUM) capsule 4 mg (4 mg Oral Given 05/09/18 1106)  simethicone (MYLICON) 40 IF/0.2DX suspension 40 mg (40 mg Oral Given 05/09/18 1218)     Initial Impression / Assessment and Plan / ED Course  I have reviewed the triage vital signs and the nursing notes.  Pertinent labs & imaging results that were available during my care of the patient were reviewed by me and considered in my medical decision making (see chart for details).     Signed out to oncoming emergency provider pending CT scan.  Final Clinical Impressions(s) / ED Diagnoses   Final diagnoses:  Constipation, unspecified constipation type  Acute respiratory failure with hypoxia [facility])    ED Discharge Orders         Ordered    HYDROcodone-acetaminophen (NORCO/VICODIN) 5-325 MG tablet  Every 4 hours PRN     05/09/18 1359    Increase activity slowly     05/09/18 1359  Diet - low sodium heart healthy     05/09/18 1359           Julianne Rice, MD 05/10/18 [phone]

## 2018-05-08 ENCOUNTER — Emergency Department (HOSPITAL_COMMUNITY): Payer: Medicare HMO

## 2018-05-08 ENCOUNTER — Observation Stay (HOSPITAL_BASED_OUTPATIENT_CLINIC_OR_DEPARTMENT_OTHER): Payer: Medicare HMO

## 2018-05-08 ENCOUNTER — Observation Stay (HOSPITAL_COMMUNITY): Payer: Medicare HMO

## 2018-05-08 DIAGNOSIS — R112 Nausea with vomiting, unspecified: Secondary | ICD-10-CM | POA: Diagnosis present

## 2018-05-08 DIAGNOSIS — I340 Nonrheumatic mitral (valve) insufficiency: Secondary | ICD-10-CM

## 2018-05-08 DIAGNOSIS — E871 Hypo-osmolality and hyponatremia: Secondary | ICD-10-CM | POA: Diagnosis present

## 2018-05-08 DIAGNOSIS — R739 Hyperglycemia, unspecified: Secondary | ICD-10-CM | POA: Diagnosis present

## 2018-05-08 DIAGNOSIS — R0602 Shortness of breath: Secondary | ICD-10-CM | POA: Diagnosis not present

## 2018-05-08 DIAGNOSIS — J189 Pneumonia, unspecified organism: Secondary | ICD-10-CM | POA: Diagnosis not present

## 2018-05-08 DIAGNOSIS — I10 Essential (primary) hypertension: Secondary | ICD-10-CM | POA: Diagnosis present

## 2018-05-08 DIAGNOSIS — R109 Unspecified abdominal pain: Secondary | ICD-10-CM | POA: Diagnosis present

## 2018-05-08 DIAGNOSIS — R778 Other specified abnormalities of plasma proteins: Secondary | ICD-10-CM | POA: Diagnosis present

## 2018-05-08 DIAGNOSIS — R0600 Dyspnea, unspecified: Secondary | ICD-10-CM | POA: Diagnosis present

## 2018-05-08 DIAGNOSIS — R194 Change in bowel habit: Secondary | ICD-10-CM | POA: Diagnosis not present

## 2018-05-08 DIAGNOSIS — D259 Leiomyoma of uterus, unspecified: Secondary | ICD-10-CM | POA: Diagnosis not present

## 2018-05-08 DIAGNOSIS — R7989 Other specified abnormal findings of blood chemistry: Secondary | ICD-10-CM | POA: Diagnosis present

## 2018-05-08 LAB — I-STAT CG4 LACTIC ACID, ED: Lactic Acid, Venous: 0.98 mmol/L (ref 0.5–1.9)

## 2018-05-08 LAB — TROPONIN I
TROPONIN I: 0.03 ng/mL — AB (ref <0.03)
Troponin I: 0.03 ng/mL (ref <0.03)
Troponin I: 0.03 ng/mL (ref <0.03)

## 2018-05-08 LAB — MAGNESIUM: Magnesium: 2 mg/dL (ref 1.7–2.4)

## 2018-05-08 LAB — HEMOGLOBIN A1C
HEMOGLOBIN A1C: 5.5 % (ref 4.8–5.6)
Mean Plasma Glucose: 111.15 mg/dL

## 2018-05-08 LAB — D-DIMER, QUANTITATIVE (NOT AT ARMC): D DIMER QUANT: 2.49 ug{FEU}/mL — AB (ref 0.00–0.50)

## 2018-05-08 LAB — POC OCCULT BLOOD, ED: FECAL OCCULT BLD: NEGATIVE

## 2018-05-08 LAB — BRAIN NATRIURETIC PEPTIDE: B Natriuretic Peptide: 105 pg/mL — ABNORMAL HIGH (ref 0.0–100.0)

## 2018-05-08 LAB — PHOSPHORUS: PHOSPHORUS: 2.8 mg/dL (ref 2.5–4.6)

## 2018-05-08 MED ORDER — TECHNETIUM TO 99M ALBUMIN AGGREGATED
4.0000 | Freq: Once | INTRAVENOUS | Status: AC | PRN
  Administered 2018-05-08: 4.4 via INTRAVENOUS

## 2018-05-08 MED ORDER — ACETAMINOPHEN 325 MG PO TABS
650.0000 mg | ORAL_TABLET | Freq: Four times a day (QID) | ORAL | Status: DC | PRN
  Administered 2018-05-08: 650 mg via ORAL
  Filled 2018-05-08: qty 2

## 2018-05-08 MED ORDER — TECHNETIUM TC 99M DIETHYLENETRIAME-PENTAACETIC ACID
33.0000 | Freq: Once | INTRAVENOUS | Status: AC | PRN
  Administered 2018-05-08: 33 via RESPIRATORY_TRACT

## 2018-05-08 MED ORDER — ONDANSETRON HCL 4 MG/2ML IJ SOLN: 4.0000 mg | Freq: Four times a day (QID) | INTRAMUSCULAR | Status: DC | PRN

## 2018-05-08 MED ORDER — IOPAMIDOL (ISOVUE-300) INJECTION 61%
75.0000 mL | Freq: Once | INTRAVENOUS | Status: AC | PRN
  Administered 2018-05-08: 75 mL via INTRAVENOUS

## 2018-05-08 MED ORDER — SODIUM CHLORIDE 0.9 % IV SOLN: INTRAVENOUS | Status: AC

## 2018-05-08 MED ORDER — POLYETHYL GLYCOL-PROPYL GLYCOL 0.4-0.3 % OP GEL: Freq: Every day | OPHTHALMIC | Status: DC

## 2018-05-08 MED ORDER — SODIUM CHLORIDE 0.9 % IV SOLN: 500.0000 mg | Freq: Once | INTRAVENOUS | Status: DC

## 2018-05-08 MED ORDER — ONDANSETRON HCL 4 MG PO TABS: 4.0000 mg | ORAL_TABLET | Freq: Four times a day (QID) | ORAL | Status: DC | PRN

## 2018-05-08 MED ORDER — ACETAMINOPHEN 650 MG RE SUPP: 650.0000 mg | Freq: Four times a day (QID) | RECTAL | Status: DC | PRN

## 2018-05-08 MED ORDER — IOPAMIDOL (ISOVUE-300) INJECTION 61%
30.0000 mL | Freq: Once | INTRAVENOUS | Status: AC | PRN
  Administered 2018-05-08: 30 mL via ORAL

## 2018-05-08 MED ORDER — MAGNESIUM CITRATE PO SOLN
1.0000 | Freq: Once | ORAL | Status: AC
  Administered 2018-05-08: 1 via ORAL
  Filled 2018-05-08: qty 296

## 2018-05-08 MED ORDER — SODIUM CHLORIDE 0.9 % IV SOLN
1.0000 g | Freq: Once | INTRAVENOUS | Status: AC
  Administered 2018-05-08: 1 g via INTRAVENOUS
  Filled 2018-05-08: qty 10

## 2018-05-08 MED ORDER — MILK AND MOLASSES ENEMA
1.0000 | Freq: Once | RECTAL | Status: DC | PRN
  Filled 2018-05-08: qty 250

## 2018-05-08 MED ORDER — LOPERAMIDE HCL 2 MG PO CAPS
4.0000 mg | ORAL_CAPSULE | Freq: Once | ORAL | Status: AC
  Administered 2018-05-08: 4 mg via ORAL
  Filled 2018-05-08: qty 2

## 2018-05-08 MED ORDER — DEXTROSE 5 % IV SOLN
250.0000 mg | Freq: Once | INTRAVENOUS | Status: AC
  Administered 2018-05-08: 250 mg via INTRAVENOUS
  Filled 2018-05-08: qty 250

## 2018-05-08 MED ORDER — METOPROLOL SUCCINATE ER 25 MG PO TB24
12.5000 mg | ORAL_TABLET | Freq: Every day | ORAL | Status: DC
  Administered 2018-05-08 – 2018-05-09 (×2): 12.5 mg via ORAL
  Filled 2018-05-08 (×2): qty 1

## 2018-05-08 MED ORDER — SODIUM CHLORIDE 0.9 % IV SOLN
INTRAVENOUS | Status: AC
  Administered 2018-05-08: 04:00:00 via INTRAVENOUS

## 2018-05-08 MED ORDER — SODIUM CHLORIDE 0.9 % IV SOLN
INTRAVENOUS | Status: AC
  Filled 2018-05-08: qty 500

## 2018-05-08 MED ORDER — HEPARIN SODIUM (PORCINE) 5000 UNIT/ML IJ SOLN
5000.0000 [IU] | Freq: Two times a day (BID) | INTRAMUSCULAR | Status: DC
  Administered 2018-05-08 – 2018-05-09 (×3): 5000 [IU] via SUBCUTANEOUS
  Filled 2018-05-08 (×3): qty 1

## 2018-05-08 NOTE — Progress Notes (Signed)
PHARMACY NOTE:  ANTIMICROBIAL RENAL DOSAGE ADJUSTMENT  Current antimicrobial regimen includes a mismatch between antimicrobial dosage and estimated renal function.  As per policy approved by the Pharmacy & Therapeutics and Medical Executive Committees, the antimicrobial dosage will be adjusted accordingly.  Current antimicrobial dosage:  Azithromycin 500mg  IV x 1  Indication: CAP  Renal Function:  Estimated Creatinine Clearance: 20.5 mL/min (by C-G formula based on SCr of 0.65 mg/dL). []      On intermittent HD, scheduled: []      On CRRT    Antimicrobial dosage has been changed to:  Azithromycin 250mg  IV once  Additional comments:   Thank you for allowing pharmacy to be a part of this patient's care.  Nyra Capes, [facility] 05/08/2018 4:21 AM

## 2018-05-08 NOTE — ED Provider Notes (Signed)
Care assumed from Dr. Lita Mains at 0000.  Patient with several days of lower right-sided abdominal pain and low back pain with vomiting.  History of constipation.  Pending CT scan to evaluate for appendicitis.  Labs are reassuring, normal lactate, negative urinalysis.  CT scan results show normal appendix.  Does show extensive stool burden and a hiatal hernia. Patient has no fecal impaction on exam. Magnesium citrate given.  Patient with mild tachycardia around 100 and mild hypoxia around 90-92.  She denies any chest pain or shortness of breath currently but family states she has become short of breath when she walks around.  Her lungs are clear and she is in no respiratory distress.  Will obtain EKG, chest x-ray.   Chest x-ray shows area of right middle lobe consolidation which may be due to patient's positioning and kyphosis.  She has not had any cough or fever. Acute on chronic compression fractures.  D-dimer is positive.  Discussed with radiology. approves low-dose IV contrast since patient is already received contrast once for her abdominal CT.  Her GFR is normal. Patient's family is hesitant for her to receive a second dose of contrast as family members have developed kidney problems after receiving contrast. They agree with VQ scan in the morning versus CT scan later.  Family agreeable to observation overnight for VQ scan in the morning.  We will treat for possible pneumonia with antibiotics. Her abdominal pain seems to be due to constipation and she is given magnesium citrate will be started on MiraLAX.  Observation admission discussed with Dr. Olevia Bowens.   EKG Interpretation  Date/Time:  Monday May 08 2018 01:29:53 EDT Ventricular Rate:  101 PR Interval:    QRS Duration: 110 QT Interval:  356 QTC Calculation: 462 R Axis:   -152 Text Interpretation:  Sinus tachycardia Atrial premature complex Right ventricular hypertrophy Nonspecific ST abnormality No significant change was  found Confirmed by Ezequiel Essex [phone]) on 05/08/2018 1:39:42 AM          Ezequiel Essex, MD 05/08/18 [phone]

## 2018-05-08 NOTE — Progress Notes (Signed)
Gina Finley is a 84 y.o. female with medical history significant of arthritis, fall, hypertension, osteoporosis, dysphagia who is coming to the emergency department with complaints of nausea, vomiting, abdominal pain after not having a bowel movement for the past 2 weeks.  She denies fever, chills, sore throat, productive cough, chest pain, palpitations, dizziness, diaphoresis, PND, orthopnea or pitting edema lower extremities.  Denies melena or hematochezia.  Denies dysuria, frequency or hematuria.  Denies polyuria, polydipsia, polyphagia or blurred vision.   She has been admitted with some abdominal pain, nausea and vomiting secondary to constipation and has been given some magnesium citrate with great improvement to the point now that she is having some loose stools.  Will try some Imodium.  She is tolerating a diet and will advance to soft.  Anticipate DC in a.m. if stable.  Two-view chest x-ray with no acute findings and VQ scan with low probability of PE.  Continue IV fluid and recheck a.m. BMP.

## 2018-05-08 NOTE — Plan of Care (Signed)
  Problem: Activity: Goal: Risk for activity intolerance will decrease Outcome: Progressing   

## 2018-05-08 NOTE — Progress Notes (Signed)
*  PRELIMINARY RESULTS* Echocardiogram 2D Echocardiogram has been performed.  Leavy Cella 05/08/2018, 10:25 AM

## 2018-05-08 NOTE — H&P (Signed)
History and Physical    Gina Finley GMW:[id number] DOB: 07-19-1918 DOA: 05/07/2018  PCP: Alycia Rossetti, MD   Patient coming from: Home.  I have personally briefly reviewed patient's old medical records in [place]  Chief Complaint: Nausea and vomiting.  HPI: Gina Finley is a 84 y.o. female with medical history significant of arthritis, fall, hypertension, osteoporosis, dysphagia who is coming to the emergency department with complaints of nausea, vomiting, abdominal pain after not having a bowel movement for the past 2 weeks.  She denies fever, chills, sore throat, productive cough, chest pain, palpitations, dizziness, diaphoresis, PND, orthopnea or pitting edema lower extremities.  Denies melena or hematochezia.  Denies dysuria, frequency or hematuria.  Denies polyuria, polydipsia, polyphagia or blurred vision.   ED Course: The patient received a azithromycin 250 mg IVPB, ceftriaxone 1 g IVPB, fentanyl 25 mcg IVP x1, Zofran 4 mg and fentanyl 25 mcg IVP.  Her chest radiograph showed right mid lung zone consolidation possibly accentuated by rotation family history CT abdomen/pelvis shows increased colon Stool burden without bowel inflammation, large hiatal hernia containing much of the stomach, cardiomegaly, atrophic pancreas, calcified uterine fibroids, age-indeterminate superior endplate compressions of L1 and L4.  Review of Systems: As per HPI otherwise 10 point review of systems negative.   Past Medical History:  Diagnosis Date  . Arthritis   . Dysphagia   . Fall   . Hypertension   . Osteoporosis     Past Surgical History:  Procedure Laterality Date  . HIP SURGERY       reports that she has never smoked. She has never used smokeless tobacco. She reports that she does not drink alcohol or use drugs.  No Known Allergies  Family history Several family members with hypertension.  Prior to Admission medications   Medication Sig Start Date End Date Taking?  Authorizing Provider  metoprolol succinate (TOPROL-XL) 25 MG 24 hr tablet Take 0.5 tablets (12.5 mg total) by mouth daily. 11/09/17  Yes Box, Modena Nunnery, MD  Polyethyl Glycol-Propyl Glycol (SYSTANE OP) Apply 1 drop to eye daily.   Yes [provider]    Physical Exam: Vitals:   05/08/18 0000 05/08/18 0100 05/08/18 0200 05/08/18 0300  BP: (!) 155/88 131/78 137/78 (!) 141/87  Pulse: 97 (!) 105 96 94  Resp:   (!) 23   Temp:      TempSrc:      SpO2: 92% 94% 95% 94%  Weight:        Constitutional: Frail, elderly female.  NAD, calm, comfortable Eyes: PERRL, lids and conjunctivae normal ENMT: Mucous membranes are moist. Posterior pharynx clear of any exudate or lesions. Neck: normal, supple, no masses, no thyromegaly Respiratory: clear to auscultation bilaterally, no wheezing, no crackles. Normal respiratory effort. No accessory muscle use.  Cardiovascular: Regular rate and rhythm, no murmurs / rubs / gallops. No extremity edema. 2+ pedal pulses. No carotid bruits.  Abdomen: Soft, mild epigastric and LLQ tenderness, no masses palpated. No hepatosplenomegaly. Bowel sounds positive.  Musculoskeletal: no clubbing / cyanosis. . Good ROM, no contractures. Normal muscle tone.  Skin: Positive ecchymosis extremities. Neurologic: CN 2-12 grossly intact. Sensation intact, DTR normal. Strength 5/5 in all 4.  Psychiatric: Normal judgment and insight. Alert and oriented x 4. Normal mood.    Labs on Admission: I have personally reviewed following labs and imaging studies  CBC: Recent Labs  Lab 05/07/18 2206  WBC 6.2  NEUTROABS 4.6  HGB 12.9  HCT 38.7  MCV 88.6  PLT 937   Basic Metabolic Panel: Recent Labs  Lab 05/07/18 2206  NA 132*  K 4.2  CL 96*  CO2 27  GLUCOSE 143*  BUN 19  CREATININE 0.65  CALCIUM 9.5   GFR: Estimated Creatinine Clearance: 20.5 mL/min (by C-G formula based on SCr of 0.65 mg/dL). Liver Function Tests: Recent Labs  Lab 05/07/18 2206  AST 27  ALT  9  ALKPHOS 86  BILITOT 1.0  PROT 6.8  ALBUMIN 3.6   Recent Labs  Lab 05/07/18 2206  LIPASE 56*   No results for input(s): AMMONIA in the last 168 hours. Coagulation Profile: No results for input(s): INR, PROTIME in the last 168 hours. Cardiac Enzymes: Recent Labs  Lab 05/07/18 2206  TROPONINI 0.03*   BNP (last 3 results) No results for input(s): PROBNP in the last 8760 hours. HbA1C: No results for input(s): HGBA1C in the last 72 hours. CBG: No results for input(s): GLUCAP in the last 168 hours. Lipid Profile: No results for input(s): CHOL, HDL, LDLCALC, TRIG, CHOLHDL, LDLDIRECT in the last 72 hours. Thyroid Function Tests: No results for input(s): TSH, T4TOTAL, FREET4, T3FREE, THYROIDAB in the last 72 hours. Anemia Panel: No results for input(s): VITAMINB12, FOLATE, FERRITIN, TIBC, IRON, RETICCTPCT in the last 72 hours. Urine analysis:    Component Value Date/Time   COLORURINE YELLOW 05/07/2018 2344   APPEARANCEUR CLEAR 05/07/2018 2344   LABSPEC 1.009 05/07/2018 2344   PHURINE 6.0 05/07/2018 2344   GLUCOSEU NEGATIVE 05/07/2018 2344   HGBUR NEGATIVE 05/07/2018 2344   BILIRUBINUR NEGATIVE 05/07/2018 2344   KETONESUR 5 (A) 05/07/2018 2344   PROTEINUR NEGATIVE 05/07/2018 2344   NITRITE NEGATIVE 05/07/2018 2344   LEUKOCYTESUR NEGATIVE 05/07/2018 2344    Radiological Exams on Admission: Dg Chest 2 View  Result Date: 05/08/2018 CLINICAL DATA:  Low back pain, constipation. EXAM: CHEST - 2 VIEW COMPARISON:  Chest radiograph August 25, 2017 FINDINGS: Stable suspected mild cardiomegaly. Tortuous calcified aorta with RIGHT mid lung zone airspace opacity, the patient is rotated to the right. Large hiatal hernia. No pleural effusion. Severe osteopenia with multilevel compression fractures, new at approximately L1. IMPRESSION: 1. RIGHT mid lung zone consolidation, possibly accentuated by rotation. 2. Osteopenia with multi level compression fractures, new at approximately L1. 3.  Large hiatal hernia. Electronically Signed   By: Elon Alas M.D.   On: 05/08/2018 01:56   Ct Abdomen Pelvis W Contrast  Result Date: 05/08/2018 CLINICAL DATA:  84 year old female with constipation presents with right lower abdominal pain and lower back pain with nausea and vomiting starting last week. EXAM: CT ABDOMEN AND PELVIS WITH CONTRAST TECHNIQUE: Multidetector CT imaging of the abdomen and pelvis was performed using the standard protocol following bolus administration of intravenous contrast. CONTRAST:  38mL ISOVUE-300 IOPAMIDOL (ISOVUE-300) INJECTION 61%, 82mL ISOVUE-300 IOPAMIDOL (ISOVUE-300) INJECTION 61% COMPARISON:  06/09/2016 pelvic radiograph FINDINGS: Lower chest: Large hiatal hernia containing much of the stomach. Cardiomegaly without pericardial effusion is identified. Trace left effusion with subsegmental bibasilar atelectasis. Hepatobiliary: Gallbladder appears contracted and is free of stones. Homogeneous enhancement of the liver given limitations due to motion artifacts involving the right lower lobe. The caudal aspect of the right hepatic lobe is slightly more heterogeneous in appearance as result. Pancreas: Atrophic with marked ectasia of the pancreatic duct. Cannot exclude the possibility of small cystic lesions of the pancreas largest approximately 18 mm in diameter. Intraductal papillary mucinous neoplasms, side branch IPMN and pseudocysts or combination thereof might account for some of the appearance. No definite  inflammation of the pancreas. Spleen: Normal size spleen without mass. Adrenals/Urinary Tract: No adrenal mass. Renal cortical thinning bilaterally without mass or obstructive uropathy. No nephrolithiasis. The urinary bladder is unremarkable for the degree of distention. Stomach/Bowel: Increased fecal retention within the colon. No bowel obstruction. Large hiatal hernia is identified containing much of the stomach. No small bowel obstruction or dilatation. Scattered  colonic diverticulosis without acute diverticulitis. Normal appendix is visualized. Vascular/Lymphatic: Aortoiliac and branch vessel atherosclerosis without aneurysm. No lymphadenopathy by CT size criteria is identified. Reproductive: Calcified uterine fibroids on the left measuring up to 1.7 cm. Other: No free air nor free fluid. Musculoskeletal: Mild-to-moderate superior endplate compressions of L1 and L4 with diffuse generalized osteopenia. Osteoarthritis of both hip joints with joint space narrowing and subchondral cystic change of the acetabular roofs. Indwelling left femoral nail fixation of the left proximal femur is partially included. IMPRESSION: 1. Increased colonic stool burden without bowel obstruction or inflammation. Normal appendix without inflammatory change. 2. Large hiatal hernia containing much of the stomach. Cardiomegaly without pericardial effusion is noted. 3. Mild heterogeneous appearance of the right hepatic lobe some which is due to motion artifacts. No definite mass or biliary dilatation. 4. Atrophic pancreas with marked ectasia cystic abnormalities of the pancreas as above. No specific follow-up recommendations given patient's age as imaging typically stops at age 73 on the consensus guidelines. 5. Calcified uterine fibroids. 6. Age-indeterminate superior endplate compressions of L1 and L4. Electronically Signed   By: Ashley Royalty M.D.   On: 05/08/2018 00:54    EKG: Independently reviewed.  Vent. rate 101 BPM PR interval * ms QRS duration 110 ms QT/QTc 356/462 ms P-R-T axes 80 208 47 Sinus tachycardia Atrial premature complex Right ventricular hypertrophy  Assessment/Plan Principal Problem:   Abdominal pain   Nausea and vomiting Secondary to constipation. Admit to telemetry/observation. Keep n.p.o. for now. Continue gentle and time-limited IV fluids. Antiemetics as needed.  Active Problems:    Dyspnea Continue supplemental oxygen. Check VQ scan. Check  echocardiogram. Not convinced by chest radiograph, but there is always a possibility of aspiration. I will repeat the chest x-ray and we will start antibiotics if needed.    Sinus tachycardia  Resume metoprolol. Check VQ scan and echocardiogram     Elevated troponin Continue level trending. Check echocardiogram.    Hypertension Continue metoprolol 12.5 mg p.o. daily. Monitor blood pressure and heart rate.    Hyponatremia Likely due to GI losses. Continue gentle IV fluids. Follow-up sodium level.    Hyperglycemia Carbohydrate modified diet. CBG monitoring. Check hemoglobin A1c.    Constipation Magnesium Site-Rite seems to be working already. Milk of molasses enema if needed.    DVT prophylaxis: Heparin SQ. Code Status: Full code. Family Communication:  Disposition Plan: Observation for VQ scan, echo and troponin level trending. Consults called:  Admission status: Observation/telemetry.   Reubin Milan MD [facility] Pager [phone].  If 7PM-7AM, please contact night-coverage [email or web address] Password [facility]  05/08/2018, 3:42 AM

## 2018-05-08 NOTE — ED Notes (Signed)
Patient transported to CT 

## 2018-05-08 NOTE — ED Notes (Signed)
Date and time results received: 05/08/18 @02 :15   Test: trop  Critical Value: 0.03  Name of Provider Notified: Dr Wyvonnia Dusky  Orders Received? Or Actions Taken?: no additional orders given

## 2018-05-08 NOTE — Care Management Obs Status (Signed)
[place] NOTIFICATION   Patient Details  Name: Lavera Vandermeer MRN: [id number] Date of Birth: 14-Apr-1918   Medicare Observation Status Notification Given:  Other (see comment)(unable to contact daughter to give MOON. pt heard of hearding and asks that ins info be discussed with daughter.)    Sherald Barge, RN 05/08/2018, 11:46 AM

## 2018-05-09 ENCOUNTER — Other Ambulatory Visit (HOSPITAL_COMMUNITY): Payer: Medicare HMO

## 2018-05-09 DIAGNOSIS — R109 Unspecified abdominal pain: Secondary | ICD-10-CM | POA: Diagnosis not present

## 2018-05-09 LAB — BASIC METABOLIC PANEL
ANION GAP: 5 (ref 5–15)
BUN: 12 mg/dL (ref 8–23)
CO2: 28 mmol/L (ref 22–32)
Calcium: 8.3 mg/dL — ABNORMAL LOW (ref 8.9–10.3)
Chloride: 100 mmol/L (ref 98–111)
Creatinine, Ser: 0.51 mg/dL (ref 0.44–1.00)
GFR calc Af Amer: >60 mL/min (ref >60)
GFR calc non Af Amer: >60 mL/min (ref >60)
Glucose, Bld: 90 mg/dL (ref 70–99)
POTASSIUM: 3.7 mmol/L (ref 3.5–5.1)
SODIUM: 133 mmol/L — AB (ref 135–145)

## 2018-05-09 LAB — GLUCOSE, CAPILLARY
Glucose-Capillary: 102 mg/dL — ABNORMAL HIGH (ref 70–99)
Glucose-Capillary: 82 mg/dL (ref 70–99)
Glucose-Capillary: 92 mg/dL (ref 70–99)

## 2018-05-09 LAB — ECHOCARDIOGRAM COMPLETE
HEIGHTINCHES: 58 in
Weight: 1206.36 oz

## 2018-05-09 MED ORDER — HYDROCODONE-ACETAMINOPHEN 5-325 MG PO TABS: 1.0000 | ORAL_TABLET | ORAL | 0 refills | Status: AC | PRN

## 2018-05-09 MED ORDER — SIMETHICONE 40 MG/0.6ML PO SUSP
40.0000 mg | Freq: Once | ORAL | Status: AC
  Administered 2018-05-09: 40 mg via ORAL
  Filled 2018-05-09: qty 30

## 2018-05-09 MED ORDER — LOPERAMIDE HCL 2 MG PO CAPS
4.0000 mg | ORAL_CAPSULE | Freq: Once | ORAL | Status: AC
  Administered 2018-05-09: 4 mg via ORAL
  Filled 2018-05-09: qty 2

## 2018-05-09 MED ORDER — POLYVINYL ALCOHOL 1.4 % OP SOLN
1.0000 [drp] | Freq: Every day | OPHTHALMIC | Status: DC
  Administered 2018-05-09: 1 [drp] via OPHTHALMIC
  Filled 2018-05-09: qty 15

## 2018-05-09 NOTE — Discharge Summary (Signed)
Physician Discharge Summary  Gina Finley QJJ:[id number] DOB: [birth year] DOA: 05/07/2018  PCP: Alycia Rossetti, MD  Admit date: 05/07/2018  Discharge date: 05/09/2018  Admitted From:Home  Disposition:  Home  Recommendations for Outpatient Follow-up:  1. Follow up with PCP in 1-2 weeks  Home Health:N/A  Equipment/Devices:None  Discharge Condition:Stable  CODE STATUS: Full  Diet recommendation: Heart Healthy  Brief/Interim Summary:  Gina Finley a 84 y.o.femalewith medical history significant ofarthritis, fall, hypertension, osteoporosis, dysphagia who is coming to the emergency department with complaints of nausea, vomiting, abdominal pain after not having a bowel movement for the past 2 weeks.  She had been admitted with some abdominal pain and nausea as well as vomiting secondary to constipation.  She was also noted to have dyspnea initially and underwent full work-up with VQ scan and chest x-rays with no acute findings and low probability for PE.  She was given magnesium citrate as well as enema for her constipation resulting in frequent, loose bowel movements that were quite bothersome and therefore, she was observed for another night.  She was given some Imodium with great relief noted and was ambulated this morning with no significant respiratory distress or hypoxemia noted.  She is stable for discharge at this time and has been given a prescription for some Norco as needed for severe low back pain that she experiences from time to time.  Daughter understands that the pain medications are only as needed for emergencies.  No other acute events noted during the course of this admission.  Discharge Diagnoses:  Principal Problem:   Abdominal pain Active Problems:   Sinus tachycardia   Constipation   Elevated troponin   Hypertension   Hyponatremia   Hyperglycemia   Nausea and vomiting   Dyspnea    Discharge Instructions  Discharge Instructions    Diet - low sodium  heart healthy   Complete by:  As directed    Increase activity slowly   Complete by:  As directed      Allergies as of 05/09/2018   No Known Allergies     Medication List    TAKE these medications   HYDROcodone-acetaminophen 5-325 MG tablet Commonly known as:  NORCO/VICODIN Take 1 tablet by mouth every 4 (four) hours as needed for up to 5 days for moderate pain.   metoprolol succinate 25 MG 24 hr tablet Commonly known as:  TOPROL-XL Take 0.5 tablets (12.5 mg total) by mouth daily.   SYSTANE OP Apply 1 drop to eye daily.      Follow-up Information    Raymond, Modena Nunnery, MD Follow up in 1 week(s).   Specialty:  Family Medicine Contact information: [address] [address] [phone]          No Known Allergies  Consultations:  None   Procedures/Studies: Dg Chest 2 View  Result Date: 05/08/2018 CLINICAL DATA:  84 year old female with shortness of breath EXAM: CHEST - 2 VIEW COMPARISON:  Prior chest x-ray 05/08/2018 FINDINGS: Similar appearance of the chest with stable cardiac and mediastinal contours. Large hiatal hernia again noted. Atherosclerotic calcifications throughout the abdominal aorta. Marked pulmonary hyperinflation with chronic bronchitic and interstitial changes consistent with underlying COPD. Severely exaggerated thoracic kyphosis with multiple chronic compression fractures. No definite acute osseous abnormality. No evidence of pneumothorax, pleural effusion or new airspace opacity. IMPRESSION: Stable chest x-ray without evidence of acute cardiopulmonary process. Electronically Signed   By: Jacqulynn Cadet M.D.   On: 05/08/2018 10:50   Dg  Chest 2 View  Result Date: 05/08/2018 CLINICAL DATA:  Low back pain, constipation. EXAM: CHEST - 2 VIEW COMPARISON:  Chest radiograph August 25, 2017 FINDINGS: Stable suspected mild cardiomegaly. Tortuous calcified aorta with RIGHT mid lung zone airspace opacity, the patient is rotated to the right.  Large hiatal hernia. No pleural effusion. Severe osteopenia with multilevel compression fractures, new at approximately L1. IMPRESSION: 1. RIGHT mid lung zone consolidation, possibly accentuated by rotation. 2. Osteopenia with multi level compression fractures, new at approximately L1. 3. Large hiatal hernia. Electronically Signed   By: Elon Alas M.D.   On: 05/08/2018 01:56   Ct Abdomen Pelvis W Contrast  Result Date: 05/08/2018 CLINICAL DATA:  84 year old female with constipation presents with right lower abdominal pain and lower back pain with nausea and vomiting starting last week. EXAM: CT ABDOMEN AND PELVIS WITH CONTRAST TECHNIQUE: Multidetector CT imaging of the abdomen and pelvis was performed using the standard protocol following bolus administration of intravenous contrast. CONTRAST:  24mL ISOVUE-300 IOPAMIDOL (ISOVUE-300) INJECTION 61%, 3mL ISOVUE-300 IOPAMIDOL (ISOVUE-300) INJECTION 61% COMPARISON:  06/09/2016 pelvic radiograph FINDINGS: Lower chest: Large hiatal hernia containing much of the stomach. Cardiomegaly without pericardial effusion is identified. Trace left effusion with subsegmental bibasilar atelectasis. Hepatobiliary: Gallbladder appears contracted and is free of stones. Homogeneous enhancement of the liver given limitations due to motion artifacts involving the right lower lobe. The caudal aspect of the right hepatic lobe is slightly more heterogeneous in appearance as result. Pancreas: Atrophic with marked ectasia of the pancreatic duct. Cannot exclude the possibility of small cystic lesions of the pancreas largest approximately 18 mm in diameter. Intraductal papillary mucinous neoplasms, side branch IPMN and pseudocysts or combination thereof might account for some of the appearance. No definite inflammation of the pancreas. Spleen: Normal size spleen without mass. Adrenals/Urinary Tract: No adrenal mass. Renal cortical thinning bilaterally without mass or obstructive  uropathy. No nephrolithiasis. The urinary bladder is unremarkable for the degree of distention. Stomach/Bowel: Increased fecal retention within the colon. No bowel obstruction. Large hiatal hernia is identified containing much of the stomach. No small bowel obstruction or dilatation. Scattered colonic diverticulosis without acute diverticulitis. Normal appendix is visualized. Vascular/Lymphatic: Aortoiliac and branch vessel atherosclerosis without aneurysm. No lymphadenopathy by CT size criteria is identified. Reproductive: Calcified uterine fibroids on the left measuring up to 1.7 cm. Other: No free air nor free fluid. Musculoskeletal: Mild-to-moderate superior endplate compressions of L1 and L4 with diffuse generalized osteopenia. Osteoarthritis of both hip joints with joint space narrowing and subchondral cystic change of the acetabular roofs. Indwelling left femoral nail fixation of the left proximal femur is partially included. IMPRESSION: 1. Increased colonic stool burden without bowel obstruction or inflammation. Normal appendix without inflammatory change. 2. Large hiatal hernia containing much of the stomach. Cardiomegaly without pericardial effusion is noted. 3. Mild heterogeneous appearance of the right hepatic lobe some which is due to motion artifacts. No definite mass or biliary dilatation. 4. Atrophic pancreas with marked ectasia cystic abnormalities of the pancreas as above. No specific follow-up recommendations given patient's age as imaging typically stops at age 31 on the consensus guidelines. 5. Calcified uterine fibroids. 6. Age-indeterminate superior endplate compressions of L1 and L4. Electronically Signed   By: Ashley Royalty M.D.   On: 05/08/2018 00:54   Nm Pulmonary Vent And Perf (v/q Scan)  Result Date: 05/08/2018 CLINICAL DATA:  Suspected pulmonary embolus. Complains of nausea, vomiting, abdominal pain after not having a bowel movement for the past 2 weeks. EXAM: NUCLEAR MEDICINE  VENTILATION - PERFUSION LUNG SCAN TECHNIQUE: Ventilation images were obtained in multiple projections using inhaled aerosol Tc-48m DTPA. Perfusion images were obtained in multiple projections after intravenous injection of Tc-17m-MAA. RADIOPHARMACEUTICALS:  33.0 mCi of Tc-49m DTPA aerosol inhalation and 4.4 mCi Tc7m-MAA IV COMPARISON:  Chest x-ray on 05/08/2018 FINDINGS: Ventilation: Patchy ventilation without large defect. Perfusion: Sub-segmental pleural-based defects without evidence for segmental or larger defect. IMPRESSION: Low probability for clinically significant pulmonary embolus. Electronically Signed   By: Nolon Nations M.D.   On: 05/08/2018 10:43     Discharge Exam: Vitals:   05/09/18 1310 05/09/18 1317  BP:    Pulse:    Resp:    Temp:    SpO2: 93% 91%   Vitals:   05/08/18 2304 05/09/18 0556 05/09/18 1310 05/09/18 1317  BP: (!) 154/74 135/73    Pulse: 83 84    Resp: 18 18    Temp: (!) 97.4 F (36.3 C) 98.1 F (36.7 C)    TempSrc: Oral Oral    SpO2: 94% 97% 93% 91%  Weight:      Height:        General: Pt is alert, awake, not in acute distress; hard of hearing. Cardiovascular: RRR, S1/S2 +, no rubs, no gallops Respiratory: CTA bilaterally, no wheezing, no rhonchi Abdominal: Soft, NT, ND, bowel sounds + Extremities: no edema, no cyanosis    The results of significant diagnostics from this hospitalization (including imaging, microbiology, ancillary and laboratory) are listed below for reference.     Microbiology: No results found for this or any previous visit (from the past 240 hour(s)).   Labs: BNP (last 3 results) Recent Labs    05/07/18 2206  BNP 657.8*   Basic Metabolic Panel: Recent Labs  Lab 05/07/18 2206 05/08/18 0429 05/09/18 0552  NA 132*  --  133*  K 4.2  --  3.7  CL 96*  --  100  CO2 27  --  28  GLUCOSE 143*  --  90  BUN 19  --  12  CREATININE 0.65  --  0.51  CALCIUM 9.5  --  8.3*  MG  --  2.0  --   PHOS  --  2.8  --    Liver  Function Tests: Recent Labs  Lab 05/07/18 2206  AST 27  ALT 9  ALKPHOS 86  BILITOT 1.0  PROT 6.8  ALBUMIN 3.6   Recent Labs  Lab 05/07/18 2206  LIPASE 56*   No results for input(s): AMMONIA in the last 168 hours. CBC: Recent Labs  Lab 05/07/18 2206  WBC 6.2  NEUTROABS 4.6  HGB 12.9  HCT 38.7  MCV 88.6  PLT 279   Cardiac Enzymes: Recent Labs  Lab 05/07/18 2206 05/08/18 0429 05/08/18 0814  TROPONINI 0.03* 0.03* 0.03*   BNP: Invalid input(s): POCBNP CBG: Recent Labs  Lab 05/09/18 0003 05/09/18 0734 05/09/18 1120  GLUCAP 102* 82 92   D-Dimer Recent Labs    05/07/18 2206  DDIMER 2.49*   Hgb A1c Recent Labs    05/08/18 0818  HGBA1C 5.5   Lipid Profile No results for input(s): CHOL, HDL, LDLCALC, TRIG, CHOLHDL, LDLDIRECT in the last 72 hours. Thyroid function studies No results for input(s): TSH, T4TOTAL, T3FREE, THYROIDAB in the last 72 hours.  Invalid input(s): FREET3 Anemia work up No results for input(s): VITAMINB12, FOLATE, FERRITIN, TIBC, IRON, RETICCTPCT in the last 72 hours. Urinalysis    Component Value Date/Time   COLORURINE YELLOW 05/07/2018 2344   APPEARANCEUR  CLEAR 05/07/2018 2344   LABSPEC 1.009 05/07/2018 2344   PHURINE 6.0 05/07/2018 2344   GLUCOSEU NEGATIVE 05/07/2018 2344   HGBUR NEGATIVE 05/07/2018 2344   BILIRUBINUR NEGATIVE 05/07/2018 2344   KETONESUR 5 (A) 05/07/2018 2344   PROTEINUR NEGATIVE 05/07/2018 2344   NITRITE NEGATIVE 05/07/2018 2344   LEUKOCYTESUR NEGATIVE 05/07/2018 2344   Sepsis Labs Invalid input(s): PROCALCITONIN,  WBC,  LACTICIDVEN Microbiology No results found for this or any previous visit (from the past 240 hour(s)).   Time coordinating discharge: 25 minutes  SIGNED:   Rodena Goldmann, DO [facility] 05/09/2018, 2:09 PM Pager [phone]  If 7PM-7AM, please contact night-coverage [email or web address] Password [email or web address]

## 2018-05-16 ENCOUNTER — Ambulatory Visit (INDEPENDENT_AMBULATORY_CARE_PROVIDER_SITE_OTHER): Payer: Medicare HMO | Admitting: Family Medicine

## 2018-05-16 ENCOUNTER — Encounter: Payer: Self-pay | Admitting: Family Medicine

## 2018-05-16 ENCOUNTER — Other Ambulatory Visit: Payer: Self-pay

## 2018-05-16 VITALS — BP 122/64 | HR 56 | Temp 98.1°F | Resp 16

## 2018-05-16 DIAGNOSIS — G8929 Other chronic pain: Secondary | ICD-10-CM | POA: Diagnosis not present

## 2018-05-16 DIAGNOSIS — S22000G Wedge compression fracture of unspecified thoracic vertebra, subsequent encounter for fracture with delayed healing: Secondary | ICD-10-CM

## 2018-05-16 DIAGNOSIS — M8000XD Age-related osteoporosis with current pathological fracture, unspecified site, subsequent encounter for fracture with routine healing: Secondary | ICD-10-CM

## 2018-05-16 DIAGNOSIS — R2681 Unsteadiness on feet: Secondary | ICD-10-CM | POA: Diagnosis not present

## 2018-05-16 DIAGNOSIS — K5901 Slow transit constipation: Secondary | ICD-10-CM | POA: Diagnosis not present

## 2018-05-16 DIAGNOSIS — E46 Unspecified protein-calorie malnutrition: Secondary | ICD-10-CM

## 2018-05-16 DIAGNOSIS — M549 Dorsalgia, unspecified: Secondary | ICD-10-CM

## 2018-05-16 DIAGNOSIS — E440 Moderate protein-calorie malnutrition: Secondary | ICD-10-CM

## 2018-05-16 MED ORDER — OXYCODONE HCL 5 MG/5ML PO SOLN: 1.0000 mg | Freq: Four times a day (QID) | ORAL | 0 refills | Status: DC | PRN

## 2018-05-16 NOTE — Progress Notes (Signed)
Patient ID: Gina Finley, female    DOB: 02/27/1918, 84 y.o.   MRN: [id number]  PCP: Alycia Rossetti, MD  Chief Complaint  Patient presents with  . Hospital F/U    hypoxia, constipation, back pain    Subjective:   Gina Finley is a 84 y.o. female, presents to clinic with CC of worsening chronic back pain, decreased appetite, and follow-up form hospitalization on May 07, 2018 for abdominal pain and constipation.  In the hospital imaging revealed additional compression fractures in her lumbar and sacral spine.  Her daughter is here with her, does take care of her at home.  Since coming home patient's bowels have been moving with soft stools and is only been skipping 1 or 2 days occasionally.  She has not been eating very much.  She is complaining mostly of severe constant pain located to her back and spine.  She is afraid to lay down, every turn she is in pain, she is mostly sitting up with pillows around her.  Her daughter is concerned with 2 spots on her spine where the skin is very sensitive and getting a little red.  There is no wound or drainage currently but very painful to the touch with any movement.  Daughter takes care of her and mostly is helping her sit up questioning around her.  She sleeps sitting up.  Patient was given a few Norco pills which the patient is having difficulty taking the daughter is breaking them in question them in the applesauce and it helps a little bit when she can take some of it but she is having difficulty with that.  Daughter reports that she is able to transfer and walk short distances with assistance.  Since being discharged in the hospital she has not left the house until today.  She was able to get out of the car independently but is in a wheelchair in the clinic today.  Patient has had no fever, chest pain, unsure about any weight loss.  No nausea or vomiting.  Daughter was told that she had a hypoxic episode while in the hospital and was to  follow-up on it here today.  Daughter has not seen the patient have any shortness of breath, retractions, wheezing, apneic episodes, choking or syncopal episodes.    Bowel movements - started miralax today.  Last BM yesterday or the day before.  Ok urinating.  Miralax one capsule given, daughter cleans out stool out of a hat and notes its brown and soft..    Pt hardly eating  - barely eats a little toast, ensure - 2-3 sips.  Before going into hospital she would drink at least one ensure.    Ambulation - hospital since getting home this is the first time shes come out of the house.  Walks with RW,   Pt is able to provide some history, but she is HOH.  She endorses severe constant pain, if afraid to lay down or twist or move because of multiple fractures.  She sits up all the time because of both of these things.  She has no appetite.  Her abdomen is still uncomfortable to the right side.  When asking the patient and patient's daughter whether or not they have advanced directives, power of attorney or living will or have discussed any goals of care, they actually have no response except for they do not have advanced directives done.  In reviewing hospital records there was not any physical therapy evaluation her  home health ordered.  She was only in the hospital for 2 days.  Could see only one hypoxic episode and vital signs recorded while she was hospitalized and there is negative imaging ruling out PE and otherwise I think that she had constipation contributing to abdominal pain.  Patient and patient daughter state there has been no palliative care consults or home health consult in the past.  Patient has never been in a nursing home with her daughter primarily takes care of her.  Patient Active Problem List   Diagnosis Date Noted  . Abdominal pain 05/08/2018  . Elevated troponin 05/08/2018  . Hypertension 05/08/2018  . Hyponatremia 05/08/2018  . Hyperglycemia 05/08/2018  . Nausea and vomiting  05/08/2018  . Dyspnea 05/08/2018  . Seborrheic keratoses 12/13/2017  . Protein-calorie malnutrition ([place]) 01/28/2017  . Constipation 01/28/2017  . Sinus tachycardia 09/29/2016  . Dysphagia 09/29/2016  . Osteoporosis 09/29/2016  . Gait instability 09/29/2016     Prior to Admission medications   Medication Sig Start Date End Date Taking? Authorizing Provider  HYDROcodone-acetaminophen (NORCO/VICODIN) 5-325 MG tablet Take 1 tablet by mouth every 4 (four) hours as needed for moderate pain.   Yes [provider]  metoprolol succinate (TOPROL-XL) 25 MG 24 hr tablet Take 0.5 tablets (12.5 mg total) by mouth daily. 11/09/17  Yes [place], Modena Nunnery, MD  Polyethyl Glycol-Propyl Glycol (SYSTANE OP) Apply 1 drop to eye daily.   Yes [provider]     No Known Allergies   History reviewed. No pertinent family history.   Social History   Socioeconomic History  . Marital status: Widowed    Spouse name: Not on file  . Number of children: Not on file  . Years of education: Not on file  . Highest education level: Not on file  Occupational History  . Not on file  Social Needs  . Financial resource strain: Not on file  . Food insecurity:    Worry: Not on file    Inability: Not on file  . Transportation needs:    Medical: Not on file    Non-medical: Not on file  Tobacco Use  . Smoking status: Never Smoker  . Smokeless tobacco: Never Used  Substance and Sexual Activity  . Alcohol use: No  . Drug use: No  . Sexual activity: Not Currently  Lifestyle  . Physical activity:    Days per week: Not on file    Minutes per session: Not on file  . Stress: Not on file  Relationships  . Social connections:    Talks on phone: Not on file    Gets together: Not on file    Attends religious service: Not on file    Active member of club or organization: Not on file    Attends meetings of clubs or organizations: Not on file    Relationship status: Not on file  . Intimate  partner violence:    Fear of current or ex partner: Not on file    Emotionally abused: Not on file    Physically abused: Not on file    Forced sexual activity: Not on file  Other Topics Concern  . Not on file  Social History Narrative  . Not on file     Review of Systems  Constitutional: Negative for chills, diaphoresis, fatigue and fever.  HENT: Negative.   Eyes: Negative.   Respiratory: Negative.   Cardiovascular: Negative.   Gastrointestinal: Positive for abdominal pain.  Endocrine: Negative.   Genitourinary: Negative.  Musculoskeletal: Positive for back pain and gait problem.  Skin: Negative.  Negative for pallor, rash and wound.  Allergic/Immunologic: Negative.   Hematological: Negative.   Psychiatric/Behavioral: Negative.   10 Systems reviewed and are negative for acute change except as noted in the HPI.      Objective:    Vitals:   05/16/18 1006  BP: 122/64  Pulse: (!) 56  Resp: 16  Temp: 98.1 F (36.7 C)  TempSrc: Oral  SpO2: 94%    Physical Exam  Constitutional: She appears well-developed. No distress.  Cachectic and frail elderly female alert, hard of hearing, answers questions appropriately when she can hear the question  HENT:  Head: Normocephalic and atraumatic.  Nose: Nose normal.  Eyes: Pupils are equal, round, and reactive to light. Conjunctivae are normal. Right eye exhibits no discharge. Left eye exhibits no discharge.  Neck: No tracheal deviation present.  Cardiovascular: Normal rate and regular rhythm.  Pulmonary/Chest: Effort normal. No stridor. No respiratory distress.  Abdominal: Soft. Normal appearance and bowel sounds are normal. She exhibits shifting dullness. She exhibits no abdominal bruit and no pulsatile midline mass. There is tenderness (to circled area, mild ttp, no guarding no rebound tenderness). There is no rigidity, no rebound, no guarding and no CVA tenderness. No hernia.    Musculoskeletal: She exhibits tenderness.        Cervical back: She exhibits decreased range of motion and pain.       Thoracic back: She exhibits decreased range of motion, tenderness, bony tenderness and pain. She exhibits no edema and no spasm.       Lumbar back: She exhibits decreased range of motion, tenderness and pain. She exhibits no edema.       Back:  ttp to thoracic spine Pt can sit and stand with assistance, will not do any other movements, limited by pain.  No step off palpated to spinal processes from cervical to lumbar spine.   hyperkyphotic  It seated position in WC, pt groans and grimaces trying to lean forward for me to examine spine Mild skin erythema roughly 2 cm diameter, circular over two midline thoracic vertebra  Neurological: She is alert. She displays atrophy. She displays no tremor. No sensory deficit.  Oriented to person and situation [facility], unable to ambulate, but transfers with 1-person assistance  Skin: Skin is warm and dry. No rash noted. She is not diaphoretic.  Psychiatric: She has a normal mood and affect. Her behavior is normal.  Nursing note and vitals reviewed.     Wt Readings from Last 5 Encounters:  05/08/18 75 lb 6.4 oz (34.2 kg)  12/13/17 77 lb 6.4 oz (35.1 kg)  09/21/17 77 lb 12.8 oz (35.3 kg)  06/14/17 80 lb (36.3 kg)  01/28/17 81 lb 3.2 oz (36.8 kg)     Assessment & Plan:      ICD-10-CM   1. Slow transit constipation K59.01 Ambulatory referral to [place]   continue miralax daily and offering variety of foods and ample liquids  2. Moderate protein-calorie malnutrition ([place]) E44.0 Ambulatory referral to Home Health    Amb Referral to Palliative Care  3. Age-related osteoporosis with current pathological fracture with routine healing, subsequent encounter M80.00XD Ambulatory referral to [place]    oxyCODONE (ROXICODONE) 5 MG/5ML solution    Amb Referral to Palliative Care  4. Closed compression fracture of thoracic vertebra with delayed healing, subsequent encounter S22.000G  Ambulatory referral to [place]    oxyCODONE (ROXICODONE) 5 MG/5ML solution  Amb Referral to Palliative Care  5. Gait instability R26.81 Ambulatory referral to Home Health  6. Protein-calorie malnutrition, unspecified severity ([place]) E46 Amb Referral to Palliative Care   no drastic weight loss since hospitalization, only half a lb, continue to try and offer high calorie foods - ensure 1-2 a day  7. Chronic midline back pain, unspecified back location M54.9 oxyCODONE (ROXICODONE) 5 MG/5ML solution   G89.29 Amb Referral to Palliative Care    VS repeated prior to leaving clinic.  HR felt much higher than initial with vitals. SpO2-98% 88HR, normal HR No desaturation while at rest.  No increased WOB.  I reviewed VS of pt while admitted and there was only one reading where pt Sats were low, the remaining are >90%.  Patient's daughter does not think that she was on any narcotics while her oxygen saturation dropped.    Patient last had a soft bowel movement yesterday and was given MiraLAX today.  She is eating very little and so expect slightly decreased stools and discussed this with her daughter.    1 week follow up for recheck of pain and of weight.  Discussed pt's goals and proposed the question to pt and daughter to discuss what her wishes are and goals of care going forward.  Have ordered home health and palliative care consult. Suspect pt will need support and care in the home to manage severe daily pain that is worsening with recurrent fx. Will have difficulty helping her body if she doesn't have enough adequate nutrition.  Pain management will be difficult as well because of the side effects of pain meds including constipation and respiratory depression.  I discussed dosing of narcotic pain medicines with Dr. Dennard Schaumann who advised doing low-dose liquid oxycodone.  I done dosing comparable to what she was prescribed in the hospital, and getting arranged with a even lower dosing to be very  conservative.  Discussed with the daughter watching her respiratory status or getting a home pulse ox monitor to help her monitor this.    Delsa Grana, PA-C 05/16/18 10:28 AM

## 2018-05-16 NOTE — Patient Instructions (Signed)
PAIN MED - I WILL CALL YOU  HOME HEALTH WILL CONTACT YOU   PUSH ENSURE AS MUCH AS YOU CAN   Chronic Pain, Adult Chronic pain is a type of pain that lasts or keeps coming back (recurs) for at least six months. You may have chronic headaches, abdominal pain, or body pain. Chronic pain may be related to an illness, such as fibromyalgia or complex regional pain syndrome. Sometimes the cause of chronic pain is not known. Chronic pain can make it hard for you to do daily activities. If not treated, chronic pain can lead to other health problems, including anxiety and depression. Treatment depends on the cause and severity of your pain. You may need to work with a pain specialist to come up with a treatment plan. The plan may include medicine, counseling, and physical therapy. Many people benefit from a combination of two or more types of treatment to control their pain. Follow these instructions at home: Lifestyle  Consider keeping a pain diary to share with your health care providers.  Consider talking with a mental health care provider (psychologist) about how to cope with chronic pain.  Consider joining a chronic pain support group.  Try to control or lower your stress levels. Talk to your health care provider about strategies to do this. General instructions   Take over-the-counter and prescription medicines only as told by your health care provider.  Follow your treatment plan as told by your health care provider. This may include: ? Gentle, regular exercise. ? Eating a healthy diet that includes foods such as vegetables, fruits, fish, and lean meats. ? Cognitive or behavioral therapy. ? Working with a Community education officer. ? Meditation or yoga. ? Acupuncture or massage therapy. ? Aroma, color, light, or sound therapy. ? Local electrical stimulation. ? Shots (injections) of numbing or pain-relieving medicines into the spine or the area of pain.  Check your pain level as told by your  health care provider. Ask your health care provider if you should use a pain scale.  Learn as much as you can about how to manage your chronic pain. Ask your health care provider if an intensive pain rehabilitation program or a chronic pain specialist would be helpful.  Keep all follow-up visits as told by your health care provider. This is important. Contact a health care provider if:  Your pain gets worse.  You have new pain.  You have trouble sleeping.  You have trouble doing your normal activities.  Your pain is not controlled with treatment.  Your have side effects from pain medicine.  You feel weak. Get help right away if:  You lose feeling or have numbness in your body.  You lose control of bowel or bladder function.  Your pain suddenly gets much worse.  You develop shaking or chills.  You develop confusion.  You develop chest pain.  You have trouble breathing or shortness of breath.  You pass out.  You have thoughts about hurting yourself or others. This information is not intended to replace advice given to you by your health care provider. Make sure you discuss any questions you have with your health care provider. Document Released: 05/15/2002 Document Revised: 04/22/2016 Document Reviewed: 02/10/2016 Elsevier Interactive Patient Education  2018 [employer]. Failure to Thrive, Adult Failure to thrive is a group of problems. These problems include eating too little and losing weight. People who have this condition may do fewer and fewer activities over time. They may lose interest in being with  friends or they may not want to eat or drink. Follow these instructions at home:  Take medicines only as told by your doctor.  Eat a healthy, well-balanced diet. Make sure that you eat enough.  Be active. Do strength training. A physical therapist can help to set up an exercise program that fits you.  Make sure that you are safe at home.  Make sure that you  have a plan for what to do if you cannot make decisions for yourself. Contact a doctor if:  You are not able to eat well.  You are not able to move around.  You feel very sad.  You feel very hopeless. Get help right away if:  You think about ending your life.  You cannot eat or drink.  You do not get out of bed.  Staying at home is not safe.  You have a fever. This information is not intended to replace advice given to you by your health care provider. Make sure you discuss any questions you have with your health care provider. Document Released: 08/12/2011 Document Revised: 01/29/2016 Document Reviewed: 11/18/2014 Elsevier Interactive Patient Education  2018 [employer]. Malnutrition Malnutrition is any condition in which nutrition is poor. There are many forms of malnutrition. A common form is having too little of one kind of nutrient (nutritional deficiency). Nutrients include proteins, minerals, carbohydrates, fats, and vitamins. They provide the body with energy and keep the body working normally. Malnutrition ranges from mild to severe. The condition affects the body's defense system (immune system). Because of this, people who are malnourished are more likely to develop health problems and get sick. What are the causes? Causes of malnutrition include:  Eating an unbalanced diet.  Eating too much of certain foods.  Eating too little.  Conditions that decrease the body's ability to use nutrients.  What increases the risk? Risk factors include:  Pregnancy and lactation. Women who are pregnant may become malnourished if they do not increase their nutrient intake. They are also susceptible to folic acid deficiency.  Increasing age. The body's ability to absorb nutrients decreases with age. This can contribute to iron, calcium, and vitamin D deficiencies.  Alcohol or drug dependency. Addiction often leads to a lifestyle in which proper nourishment is ignored.  Dependency can also hurt the metabolism and the body's ability to absorb nutrients. Alcoholism is a major cause of thiamine deficiency and can lead to deficiencies of magnesium, zinc, and other vitamins.  Eating disorders, such as anorexia nervosa. People with these disorders may eat too little or too much.  Chewing or swallowing problems. People with these disorders may not eat enough.  Certain diseases, including: ? Long-lasting (chronic) diseases. Chronic diseases tend to affect the absorption of calcium, iron, and vitamins B12, A, D, E, and K. ? Liver disease. Liver disease affects the storage of vitamins A and B12. It also interferes with the metabolism of protein and energy sources. ? Kidney disease. Kidney disease may cause deficiencies of protein, iron, and vitamin D. ? Cancer or AIDS. These diseases can cause a loss of appetite. ? Cystic fibrosis. This disease can make it difficult for the body to absorb nutrients.  Certain diets, including. ? The vegetarian diet. Vegetarians are at risk for iron deficiency. ? The vegan diet. Vegans are susceptible to vitamin B12, calcium, iron, vitamin D, and zinc deficiencies. ? The fruitarian diet. This diet can be deficient in protein, sodium, and many micronutrients. ? Many commercial "fad" diets, including those that claim  to enhance well-being and reduce weight. ? Very low calorie diets.  Low income. People with a low income may have trouble paying for nutritious foods.  What are the signs or symptoms? Signs and symptoms depend on the kind of malnutrition you have. Common symptoms include:  Fatigue.  Weakness.  Dizziness.  Fainting  Weight loss.  Poor immune response.  Lack of menstruation.  Hair loss.  Poor memory.  How is this diagnosed? Malnutrition may be diagnosed by:  A medical history.  A dietary history.  A physical exam. This may include a measurement of your body mass index (BMI).  Blood tests.  How is  this treated? Treatments vary depending on the cause of the malnutrition. Common treatments include:  Dietary changes.  Dietary supplements, such as vitamins and minerals.  Treatment of any underlying conditions.  Follow these instructions at home:  Eat a balanced diet.  Take dietary supplements as directed by your health care provider.  Exercise regularly. Exercising can improve appetite.  Keep all follow-up visits as directed by your health care provider. This is important. How is this prevented? Eating a well-balanced diet helps to prevent most forms of malnutrition. Contact a health care provider if:  You have increased weakness or fatigue.  You faint.  You stop menstruating.  You have rapid hair loss.  You have unexpected weight loss. This information is not intended to replace advice given to you by your health care provider. Make sure you discuss any questions you have with your health care provider. Document Released: 07/09/2005 Document Revised: 01/29/2016 Document Reviewed: 04/19/2014 Elsevier Interactive Patient Education  [employer].

## 2018-05-23 ENCOUNTER — Ambulatory Visit (INDEPENDENT_AMBULATORY_CARE_PROVIDER_SITE_OTHER): Payer: Medicare HMO | Admitting: Family Medicine

## 2018-05-23 ENCOUNTER — Other Ambulatory Visit: Payer: Self-pay

## 2018-05-23 ENCOUNTER — Telehealth: Payer: Self-pay

## 2018-05-23 ENCOUNTER — Encounter: Payer: Self-pay | Admitting: Family Medicine

## 2018-05-23 VITALS — BP 120/62 | HR 102 | Temp 98.4°F | Resp 18 | Ht <= 58 in | Wt 75.8 lb

## 2018-05-23 DIAGNOSIS — R2681 Unsteadiness on feet: Secondary | ICD-10-CM | POA: Diagnosis not present

## 2018-05-23 DIAGNOSIS — G8929 Other chronic pain: Secondary | ICD-10-CM | POA: Diagnosis not present

## 2018-05-23 DIAGNOSIS — M549 Dorsalgia, unspecified: Secondary | ICD-10-CM

## 2018-05-23 DIAGNOSIS — M8000XD Age-related osteoporosis with current pathological fracture, unspecified site, subsequent encounter for fracture with routine healing: Secondary | ICD-10-CM

## 2018-05-23 DIAGNOSIS — K5901 Slow transit constipation: Secondary | ICD-10-CM | POA: Diagnosis not present

## 2018-05-23 DIAGNOSIS — E46 Unspecified protein-calorie malnutrition: Secondary | ICD-10-CM

## 2018-05-23 NOTE — Telephone Encounter (Signed)
Phone call placed to patient's daughter to offer to schedule visit with Palliative Care. VM left 

## 2018-05-23 NOTE — Telephone Encounter (Signed)
Phone call placed to patient's number. Spoke with family who requested a call back in an hour.

## 2018-05-23 NOTE — Patient Instructions (Addendum)
[place] - see flyer  If you cancelled the service it may be a while before we see documentation of that and we will have to reorder.  If you can find out which service you talked to and you would like [place] to resubmit it please call us  Continue ensure supplementation   Gradually decrease amount of miralax until having a soft bowel movement a day  Weight recheck in 2-4 weeks

## 2018-05-23 NOTE — Progress Notes (Signed)
Patient ID: Gina Finley, female    DOB: 11/04/1917, 84 y.o.   MRN: [id number]  PCP: Alycia Rossetti, MD  Chief Complaint  Patient presents with  . Follow-up    Subjective:   05/23/18 Gina Finley is a 84 y.o. female here for protein calorie malnutrition, unspecified, unintensional weightloss, chronic back pain, and multiple vertebral fx, here to recheck weight, appetite and pain and skin with concern of possible early pressure wound over protruding spinal processes on back with cachectic frame/body habitus.  She her weight it up since last visit. Pt's daughter states she is still not eating very much.  She was having difficulty with taking prescribed hydrocodone pills, liquid pain medicine was given, but she has not taken it.  She is taking half a pill of norco 5-325 TID.  Patient has not been complaining of severe pain like she was in the past.  She is hesitant to take liquid pain medication because she read the side effects and it scared her.  Her daughter has been putting the Norco into applesauce and she is been tolerating it.  No dysphasia, nausea, chest pain. Constipation  -  pt reports no abdominal pain.  Appetite is a little better.  She took MiraLAX for several days after her last appointment 1 week ago her daughter states that it took a few days to have a bowel movement but then she had frequent watery and loose stools up to 3 or 4 a day for several days.  She had been giving her 1 capful a day for several days.  Daughter notes that she has been eating more of her food and is now drinking about half an Ensure but no more than that.  Patient is having normal urination, energy, mental status per her daughter.   Skin on the patient's back where it was starting to be very sore to the touch and angry bright red, has started to peel a little bit and appear to be healing.  Patient does not complain about as much pain and can move around much easier in the chair today.  Daughter has been  putting some bandages over that area and questioning her for long periods of sitting.  No open wounds, swelling, drainage.  Patient was referred to palliative care last week, also home health ordered for multiple medical problems including pathological fractures, chronic pain, fall risk, difficulty ambulating, possible pressure ulcers to her back, pain management, evaluation for OT and PT.  Daughter states that they got calls from a few places and she did tell 1 of him that her mother did not want anyone to come to the house.  She is unsure which service this is.  The daughter is going to recheck her voicemails and contact them excepting home visits.  She will contact us if the request is canceled out and inactive and will let us know so we can reorder.      HPI 05/16/18: Gina Finley is a 84 y.o. female, presents to clinic with CC of worsening chronic back pain, decreased appetite, and follow-up form hospitalization on May 07, 2018 for abdominal pain and constipation.  In the hospital imaging revealed additional compression fractures in her lumbar and sacral spine.  Her daughter is here with her, does take care of her at home.  Since coming home patient's bowels have been moving with soft stools and is only been skipping 1 or 2 days occasionally.  She has not been eating very much.  She  is complaining mostly of severe constant pain located to her back and spine.  She is afraid to lay down, every turn she is in pain, she is mostly sitting up with pillows around her.  Her daughter is concerned with 2 spots on her spine where the skin is very sensitive and getting a little red.  There is no wound or drainage currently but very painful to the touch with any movement.  Daughter takes care of her and mostly is helping her sit up questioning around her.  She sleeps sitting up.  Patient was given a few Norco pills which the patient is having difficulty taking the daughter is breaking them in question them in  the applesauce and it helps a little bit when she can take some of it but she is having difficulty with that.  Daughter reports that she is able to transfer and walk short distances with assistance.  Since being discharged in the hospital she has not left the house until today.  She was able to get out of the car independently but is in a wheelchair in the clinic today.  Patient has had no fever, chest pain, unsure about any weight loss.  No nausea or vomiting.  Daughter was told that she had a hypoxic episode while in the hospital and was to follow-up on it here today.  Daughter has not seen the patient have any shortness of breath, retractions, wheezing, apneic episodes, choking or syncopal episodes.    Bowel movements - started miralax today.  Last BM yesterday or the day before.  Ok urinating.  Miralax one capsule given, daughter cleans out stool out of a hat and notes its brown and soft..    Pt hardly eating  - barely eats a little toast, ensure - 2-3 sips.  Before going into hospital she would drink at least one ensure.    Ambulation - hospital since getting home this is the first time shes come out of the house.  Walks with RW,   Pt is able to provide some history, but she is HOH.  She endorses severe constant pain, if afraid to lay down or twist or move because of multiple fractures.  She sits up all the time because of both of these things.  She has no appetite.  Her abdomen is still uncomfortable to the right side.  When asking the patient and patient's daughter whether or not they have advanced directives, power of attorney or living will or have discussed any goals of care, they actually have no response except for they do not have advanced directives done.  In reviewing hospital records there was not any physical therapy evaluation her home health ordered.  She was only in the hospital for 2 days.  Could see only one hypoxic episode and vital signs recorded while she was hospitalized and there  is negative imaging ruling out PE and otherwise I think that she had constipation contributing to abdominal pain.  Patient and patient daughter state there has been no palliative care consults or home health consult in the past.  Patient has never been in a nursing home with her daughter primarily takes care of her.  Patient Active Problem List   Diagnosis Date Noted  . Abdominal pain 05/08/2018  . Elevated troponin 05/08/2018  . Hypertension 05/08/2018  . Hyponatremia 05/08/2018  . Hyperglycemia 05/08/2018  . Nausea and vomiting 05/08/2018  . Dyspnea 05/08/2018  . Seborrheic keratoses 12/13/2017  . Protein-calorie malnutrition ([place]) 01/28/2017  .  Constipation 01/28/2017  . Sinus tachycardia 09/29/2016  . Dysphagia 09/29/2016  . Osteoporosis 09/29/2016  . Gait instability 09/29/2016     Prior to Admission medications   Medication Sig Start Date End Date Taking? Authorizing Provider  HYDROcodone-acetaminophen (NORCO/VICODIN) 5-325 MG tablet Take 1 tablet by mouth every 4 (four) hours as needed for moderate pain.   Yes [provider]  metoprolol succinate (TOPROL-XL) 25 MG 24 hr tablet Take 0.5 tablets (12.5 mg total) by mouth daily. 11/09/17  Yes [place], Modena Nunnery, MD  Polyethyl Glycol-Propyl Glycol (SYSTANE OP) Apply 1 drop to eye daily.   Yes [provider]     No Known Allergies   History reviewed. No pertinent family history.   Social History   Socioeconomic History  . Marital status: Widowed    Spouse name: Not on file  . Number of children: Not on file  . Years of education: Not on file  . Highest education level: Not on file  Occupational History  . Not on file  Social Needs  . Financial resource strain: Not on file  . Food insecurity:    Worry: Not on file    Inability: Not on file  . Transportation needs:    Medical: Not on file    Non-medical: Not on file  Tobacco Use  . Smoking status: Never Smoker  . Smokeless tobacco: Never Used    Substance and Sexual Activity  . Alcohol use: No  . Drug use: No  . Sexual activity: Not Currently  Lifestyle  . Physical activity:    Days per week: Not on file    Minutes per session: Not on file  . Stress: Not on file  Relationships  . Social connections:    Talks on phone: Not on file    Gets together: Not on file    Attends religious service: Not on file    Active member of club or organization: Not on file    Attends meetings of clubs or organizations: Not on file    Relationship status: Not on file  . Intimate partner violence:    Fear of current or ex partner: Not on file    Emotionally abused: Not on file    Physically abused: Not on file    Forced sexual activity: Not on file  Other Topics Concern  . Not on file  Social History Narrative  . Not on file     Review of Systems  Constitutional: Negative.  Negative for activity change, appetite change (improving), chills, diaphoresis, fatigue and fever.  HENT: Negative.   Eyes: Negative.   Respiratory: Negative.   Cardiovascular: Negative.   Gastrointestinal: Negative for abdominal distention, abdominal pain, blood in stool, constipation, nausea, rectal pain and vomiting.  Endocrine: Negative.   Genitourinary: Negative.   Musculoskeletal: Positive for arthralgias, back pain and gait problem.  Skin: Positive for color change. Negative for pallor, rash and wound.  Allergic/Immunologic: Negative.   Neurological: Negative for dizziness, syncope, weakness and numbness.  Hematological: Negative.   Psychiatric/Behavioral: Negative.   10 Systems reviewed and are negative for acute change except as noted in the HPI.      Objective:    Vitals:   05/23/18 1034  BP: 120/62  Pulse: (!) 102  Resp: 18  Temp: 98.4 F (36.9 C)  TempSrc: Oral  SpO2: 96%  Weight: 75 lb 12.8 oz (34.4 kg)  Height: 4\' 10"  (1.473 m)    Physical Exam  Constitutional: She appears well-developed. No distress.  Appears stated age, appears  well, alert, non-toxic, but is cachectic and frail appearing  HENT:  Head: Normocephalic and atraumatic.  Nose: Nose normal.  Mouth/Throat: Oropharynx is clear and moist.  Eyes: Pupils are equal, round, and reactive to light. Conjunctivae are normal. Right eye exhibits no discharge. Left eye exhibits no discharge.  Neck: Normal range of motion. No tracheal deviation present.  Cardiovascular: Normal rate, regular rhythm, normal heart sounds and intact distal pulses. Exam reveals no gallop and no friction rub.  No murmur heard. Pulmonary/Chest: Effort normal and breath sounds normal. No stridor. No respiratory distress.  Abdominal: Soft. Normal appearance and bowel sounds are normal. She exhibits shifting dullness. She exhibits no distension, no abdominal bruit, no pulsatile midline mass and no mass. There is no tenderness (to circled area, mild ttp, no guarding no rebound tenderness). There is no rigidity, no rebound, no guarding and no CVA tenderness. No hernia.    Musculoskeletal: She exhibits tenderness.       Cervical back: She exhibits normal range of motion, no bony tenderness, no swelling, no edema, no deformity and no pain.       Thoracic back: She exhibits decreased range of motion (pt more mobile, twisting more, flexing and extending back more w/o pain or grimace) and tenderness. She exhibits no bony tenderness, no edema, no pain and no spasm.       Back:  Very mild ttp to thoracic spine While seated in WC in exam room, pt able to lean forward more than last visit, without pain No step off palpated to spinal processes from cervical to lumbar spine.   hyperkyphotic thoracic spine   Neurological: She is alert. She displays atrophy. She displays no tremor. No sensory deficit.  In clinic WC, easier 1 person assist to standing Follows simple instructions, answers questions appropriately Normal sensation to light touch in all extremities Moves all extremities Good grip strength  bilaterally  Skin: Skin is warm and dry. No rash noted. She is not diaphoretic. There is erythema.  See picture - mid thoracic spine  Psychiatric: She has a normal mood and affect. Her behavior is normal.  Nursing note and vitals reviewed.        Wt Readings from Last 5 Encounters:  05/23/18 75 lb 12.8 oz (34.4 kg)  05/08/18 75 lb 6.4 oz (34.2 kg)  12/13/17 77 lb 6.4 oz (35.1 kg)  09/21/17 77 lb 12.8 oz (35.3 kg)  06/14/17 80 lb (36.3 kg)     Assessment & Plan:      ICD-10-CM   1. Protein-calorie malnutrition, unspecified severity ([place]) E46    Improving, pt regaining weight Continue with ensure - recheck weight in 2 weeks  2. Chronic midline back pain, unspecified back location M54.9    G89.29    Con't norco, use conservative amount 1-2 mLs BID-TID PRN of liquid pain meds when out of norco   3. Age-related osteoporosis with current pathological fracture with routine healing, subsequent encounter M80.00XD    Still pending HH, PT/OT eval  4. Gait instability R26.81    HH and PT pending  5. Slow transit constipation K59.01    decrease amount of miralax but would not completely stop.  1/2 cap daily and decrease until having 1 soft stool.  (1/4 cap or 1/2 every other day)    Encouraged initial eval by both HH and palliative.  Pt well appearing today, moving better, more alert and interactive, weight increasing, skin on back looks improved, abd pain resolved.  Delsa Grana, PA-C 05/23/18 10:41 AM

## 2018-05-24 ENCOUNTER — Telehealth: Payer: Self-pay

## 2018-05-24 NOTE — Telephone Encounter (Signed)
Phone call placed to patient's daughter to schedule visit with Palliative Care. Visit scheduled for 05/30/18

## 2018-05-28 NOTE — Progress Notes (Signed)
    PALLIATIVE CARE CONSULT VISIT   PATIENT NAME: Gina Finley DOB: 1917/09/21 MRN: [id number]  PRIMARY CARE PROVIDER:   Alycia Rossetti, MD  REFERRING PROVIDER:  Alycia Rossetti, MD [address] [place], [place] [address]  RESPONSIBLE PARTY:   Gentry Fitz (daughter) [phone]   CC: generalized pain, loss of appetite, swollen ankles ASSESSMENT/RECOMMENDATIONS and PLAN:  Protein calorie malnutrition/anorexia/weight loss -BMI is 15.8 weighs 75lbs is 4'10" tall -patient on eats few bites of meals and frequently doesn't eat -drinks 1/2 can of ensure a day -patient reports she does not feel hungry   Severe osteoporosis/kyphosis with h/o spontaneous rib and vertebral fractures  -patient requires some assistance with bathing and putting on her clothes -requires 1 person  assistance to stand -can walk short distances with rolling walker   -Severe chronic pain 2/2 osteoporosis -taking oxycodone 2.5mg  every six hours -fractured 3 ribs and vertebrae with normal movement   -Hypertension -Constipation  -metoprolol -miralax  ACP -talked with daughter (caregiver) about goals of care; she would like to talk it over with her mother    I spent 60 minutes providing this consultation,  from 16:00 to 17:00. More than 50% of the time in this consultation was spent coordinating communication.   HISTORY OF PRESENT ILLNESS:  Gina Finley is a 84 y.o. year old female with multiple medical problems including severe protein calorie malnutrition with BMI of 15.8; anorexia with weight  Loss; eating few bites of food a day; severe osteoporosis with spontaneous pathological fractures. Palliative Care was asked to help with symptom management and address goals of care.   CODE STATUS: Full  PPS: 40% HOSPICE ELIGIBILITY/DIAGNOSIS: TBD  PAST MEDICAL HISTORY:  Past Medical History:  Diagnosis Date  . Arthritis   . Dysphagia   . Fall   . Hypertension   . Osteoporosis     SOCIAL HX:   Social History   Tobacco Use  . Smoking status: Never Smoker  . Smokeless tobacco: Never Used  Substance Use Topics  . Alcohol use: No    ALLERGIES: No Known Allergies   PERTINENT MEDICATIONS:  Outpatient Encounter Medications as of 05/30/2018  Medication Sig  . HYDROcodone-acetaminophen (NORCO/VICODIN) 5-325 MG tablet Take 1 tablet by mouth every 6 (six) hours as needed for moderate pain.  . metoprolol succinate (TOPROL-XL) 25 MG 24 hr tablet Take 0.5 tablets (12.5 mg total) by mouth daily.  Marland Kitchen oxyCODONE (ROXICODONE) 5 MG/5ML solution Take 1-2.5 mLs (1-2.5 mg total) by mouth every 6 (six) hours as needed for severe pain. (Patient not taking: Reported on 05/23/2018)  . Polyethyl Glycol-Propyl Glycol (SYSTANE OP) Apply 1 drop to eye daily.  . polyethylene glycol (MIRALAX / GLYCOLAX) packet Take 17 g by mouth daily as needed.   No facility-administered encounter medications on file as of 05/30/2018.     PHYSICAL EXAM:   General: NAD, frail appearing, cachetic  Cardiovascular: regular rate and rhythm Pulmonary: clear ant fields Abdomen: soft, nontender, + bowel sounds GU: no suprapubic tenderness Extremities: left leg with 2+ edema, severe kyphosis Skin: no rashes Neurological: Weakness but otherwise non-focal; very hard of hearing  Aleecia Tapia G Martin[place], NP

## 2018-05-30 ENCOUNTER — Encounter: Payer: Self-pay | Admitting: Nurse Practitioner

## 2018-05-30 ENCOUNTER — Other Ambulatory Visit: Payer: Medicare HMO | Admitting: Nurse Practitioner

## 2018-05-30 DIAGNOSIS — R630 Anorexia: Secondary | ICD-10-CM

## 2018-05-30 DIAGNOSIS — R2681 Unsteadiness on feet: Secondary | ICD-10-CM

## 2018-05-30 DIAGNOSIS — R634 Abnormal weight loss: Secondary | ICD-10-CM | POA: Diagnosis not present

## 2018-05-30 DIAGNOSIS — E43 Unspecified severe protein-calorie malnutrition: Secondary | ICD-10-CM | POA: Diagnosis not present

## 2018-05-30 DIAGNOSIS — Z515 Encounter for palliative care: Secondary | ICD-10-CM | POA: Diagnosis not present

## 2018-05-30 DIAGNOSIS — G8929 Other chronic pain: Secondary | ICD-10-CM

## 2018-05-31 ENCOUNTER — Encounter (HOSPITAL_COMMUNITY): Payer: Medicare HMO

## 2018-05-31 ENCOUNTER — Ambulatory Visit (INDEPENDENT_AMBULATORY_CARE_PROVIDER_SITE_OTHER): Payer: Medicare HMO | Admitting: Physician Assistant

## 2018-05-31 ENCOUNTER — Encounter: Payer: Self-pay | Admitting: Physician Assistant

## 2018-05-31 ENCOUNTER — Ambulatory Visit (HOSPITAL_BASED_OUTPATIENT_CLINIC_OR_DEPARTMENT_OTHER)
Admission: RE | Admit: 2018-05-31 | Discharge: 2018-05-31 | Disposition: A | Discharge status: Home / Self Care | Payer: Medicare HMO | Source: Ambulatory Visit

## 2018-05-31 ENCOUNTER — Ambulatory Visit (HOSPITAL_COMMUNITY)
Admission: RE | Admit: 2018-05-31 | Discharge: 2018-05-31 | Disposition: A | Discharge status: Home / Self Care | Payer: Medicare HMO | Source: Ambulatory Visit | Attending: Physician Assistant | Admitting: Physician Assistant

## 2018-05-31 ENCOUNTER — Telehealth: Payer: Self-pay | Admitting: *Deleted

## 2018-05-31 VITALS — BP 118/78 | HR 124 | Resp 22

## 2018-05-31 DIAGNOSIS — Z515 Encounter for palliative care: Secondary | ICD-10-CM | POA: Diagnosis not present

## 2018-05-31 DIAGNOSIS — M79604 Pain in right leg: Secondary | ICD-10-CM

## 2018-05-31 DIAGNOSIS — M7989 Other specified soft tissue disorders: Secondary | ICD-10-CM

## 2018-05-31 DIAGNOSIS — M818 Other osteoporosis without current pathological fracture: Secondary | ICD-10-CM

## 2018-05-31 DIAGNOSIS — E43 Unspecified severe protein-calorie malnutrition: Secondary | ICD-10-CM | POA: Diagnosis not present

## 2018-05-31 DIAGNOSIS — M79605 Pain in left leg: Secondary | ICD-10-CM

## 2018-05-31 DIAGNOSIS — R2681 Unsteadiness on feet: Secondary | ICD-10-CM

## 2018-05-31 NOTE — Telephone Encounter (Signed)
Sounds great, Will route this to [facility] because she saw her today as well.

## 2018-05-31 NOTE — Progress Notes (Signed)
Bilateral lower extremity venous duplex completed - Preliminary results. No evidence of DVT or Baker's cyst. Toma Copier, RVS 05/31/2018 6:30 PM

## 2018-05-31 NOTE — Telephone Encounter (Signed)
Received call from Stephanie Martin[place], NP with [facility]. [phone]~ telephone.   Reports that patient was seen on 05/30/2018 to admit to palliative services, but patient does qualify for hospice services due to protein calorie malnutrition and pain from osteoporosis and fractures.   Reports that patient and family have discussed and would like to proceed with hospice referral. VO given for hospice referral.   PCP will remain primary, but hospice MD will take over pain management.

## 2018-06-01 ENCOUNTER — Other Ambulatory Visit: Payer: Self-pay | Admitting: Family Medicine

## 2018-06-01 ENCOUNTER — Encounter: Payer: Self-pay | Admitting: Physician Assistant

## 2018-06-01 DIAGNOSIS — M549 Dorsalgia, unspecified: Secondary | ICD-10-CM

## 2018-06-01 DIAGNOSIS — S22000G Wedge compression fracture of unspecified thoracic vertebra, subsequent encounter for fracture with delayed healing: Secondary | ICD-10-CM

## 2018-06-01 DIAGNOSIS — G8929 Other chronic pain: Secondary | ICD-10-CM

## 2018-06-01 DIAGNOSIS — M8000XD Age-related osteoporosis with current pathological fracture, unspecified site, subsequent encounter for fracture with routine healing: Secondary | ICD-10-CM

## 2018-06-01 MED ORDER — POTASSIUM CHLORIDE ER 10 MEQ PO TBCR: 10.0000 meq | EXTENDED_RELEASE_TABLET | Freq: Every day | ORAL | 0 refills | Status: AC

## 2018-06-01 MED ORDER — OXYCODONE HCL 5 MG/5ML PO SOLN: 1.0000 mg | Freq: Four times a day (QID) | ORAL | 0 refills | Status: AC | PRN

## 2018-06-01 MED ORDER — FUROSEMIDE 20 MG PO TABS: 20.0000 mg | ORAL_TABLET | Freq: Every day | ORAL | 0 refills | Status: AC

## 2018-06-01 NOTE — Progress Notes (Signed)
Patient ID: Gina Finley MRN: [id number], DOB: 1918-03-03, 84 y.o. Date of Encounter: @DATE @  Chief Complaint:  Chief Complaint  Patient presents with  . left leg swollen    left leg numb    HPI: 84 y.o. year old female  presents with above.    She is accompanied by her daughter for visit today. Daughter states that patient has had this swelling in her feet for the past 2 days.   States that she may have had some mild swelling gradually prior to that but has really been noticeable just the past 2 days.   His feet are very swollen and feel very tight and patient is also saying that her left calf feels swollen and tight. During visit I discussed that her left calf does appear larger than the right and asked daughter if this is new or whether has been this way long-term.  Daughter states this is new.  Daughter also notes that patient is in pain and that it was very difficult to even get her here for visit.   Painful for patient to be getting in and out of the car and be in the wheelchair through this visit.   Notes that a nurse just came to the house regarding palliative care.     Past Medical History:  Diagnosis Date  . Arthritis   . Dysphagia   . Fall   . Hypertension   . Osteoporosis      Home Meds: Outpatient Medications Prior to Visit  Medication Sig Dispense Refill  . HYDROcodone-acetaminophen (NORCO/VICODIN) 5-325 MG tablet Take 1 tablet by mouth every 6 (six) hours as needed for moderate pain.    . metoprolol succinate (TOPROL-XL) 25 MG 24 hr tablet Take 0.5 tablets (12.5 mg total) by mouth daily. 45 tablet 1  . oxyCODONE (ROXICODONE) 5 MG/5ML solution Take 1-2.5 mLs (1-2.5 mg total) by mouth every 6 (six) hours as needed for severe pain. 60 mL 0  . Polyethyl Glycol-Propyl Glycol (SYSTANE OP) Apply 1 drop to eye daily.    . polyethylene glycol (MIRALAX / GLYCOLAX) packet Take 17 g by mouth daily as needed.     No facility-administered medications prior to  visit.     Allergies: No Known Allergies  Social History   Socioeconomic History  . Marital status: Widowed    Spouse name: Not on file  . Number of children: Not on file  . Years of education: Not on file  . Highest education level: Not on file  Occupational History  . Not on file  Social Needs  . Financial resource strain: Not on file  . Food insecurity:    Worry: Not on file    Inability: Not on file  . Transportation needs:    Medical: Not on file    Non-medical: Not on file  Tobacco Use  . Smoking status: Never Smoker  . Smokeless tobacco: Never Used  Substance and Sexual Activity  . Alcohol use: No  . Drug use: No  . Sexual activity: Not Currently  Lifestyle  . Physical activity:    Days per week: Not on file    Minutes per session: Not on file  . Stress: Not on file  Relationships  . Social connections:    Talks on phone: Not on file    Gets together: Not on file    Attends religious service: Not on file    Active member of club or organization: Not on file    Attends  meetings of clubs or organizations: Not on file    Relationship status: Not on file  . Intimate partner violence:    Fear of current or ex partner: Not on file    Emotionally abused: Not on file    Physically abused: Not on file    Forced sexual activity: Not on file  Other Topics Concern  . Not on file  Social History Narrative  . Not on file    History reviewed. No pertinent family history.   Review of Systems:  See HPI for pertinent ROS. All other ROS negative.    Physical Exam: Blood pressure 118/78, pulse (!) 124, resp. rate (!) 22, SpO2 92 %., There is no height or weight on file to calculate BMI. General: Thin, Frail WF in wheelchair.  Appears in no acute distress. Neck: Supple. No thyromegaly. No lymphadenopathy. Lungs: Clear bilaterally to auscultation without wheezes, rales, or rhonchi. Breathing is unlabored. Heart: RRR with S1 S2. No murmurs, rubs, or  gallops. Musculoskeletal:  Strength and tone normal for age. Extremities/Skin:  Both of her feet have significant swelling-----they feel like a balloon when touch them---they are not like kneading dough--- they are like a balloon. Her left calf is larger than right calf in diameter. She has severe tenderness to palpate her feet and her left calf. Neuro: Alert and oriented X 3. Moves all extremities spontaneously. Gait is normal. CNII-XII grossly in tact. Psych:  Responds to questions appropriately with a normal affect.     ASSESSMENT AND PLAN:  84 y.o. year old female with    1. Pain and swelling of left lower extremity She was sent immediately for venous Doppler.  This was negative for DVT. I have spoken to patient's daughter on the phone and discussed these results. Also during our phone call I discussed with the daughter -- treatment for the lower extremity edema. Discussed elevating feet.  Discussed that patient needs to be lying on the bed with feet propped up on pillows higher than heart level. I do not recommend that she be in a recliner where her heart level is elevated.  That all needs to be flat and then feet up higher.  Daughter voices understanding and agrees. Also stated that I will add some diuretic and potassium supplement for her to take 1 of each daily each morning. Reviewed that blood pressure is 118/78 today.   Her only blood pressure medication is metoprolol.  Currently on no diuretic.   Also reviewed that lab 05/09/2018 showed sodium 133, potassium 3.7, BUN 12, creatinine 0.51. Given her age of 84 and her very frail state and blood pressure 118/78 cannot use high dose diuretic. Discussed with daughter that it may take several days for the swelling to improve and discussed reasons cannot use high dose medicine to rapidly get rid of swelling. Also I have reviewed the palliative care consult note dated 05/30/2018.  Follow-up of this we did get information that that evaluation  did show that she does meet criteria to move to hospice so will proceed with referral to hospice for additional comfort care and support. - US Venous Img Lower Bilateral; Future - VAS [place] LOWER EXTREMITY VENOUS (DVT) - furosemide (LASIX) 20 MG tablet; Take 1 tablet (20 mg total) by mouth daily.  Dispense: 30 tablet; Refill: 0 - potassium chloride (K-DUR) 10 MEQ tablet; Take 1 tablet (10 mEq total) by mouth daily.  Dispense: 30 tablet; Refill: 0  2. Pain and swelling of right lower extremity She was  sent immediately for venous Doppler.  This was negative for DVT. I have spoken to patient's daughter on the phone and discussed these results. Also during our phone call I discussed with the daughter -- treatment for the lower extremity edema. Discussed elevating feet.  Discussed that patient needs to be lying on the bed with feet propped up on pillows higher than heart level. I do not recommend that she be in a recliner where her heart level is elevated.  That all needs to be flat and then feet up higher.  Daughter voices understanding and agrees. Also stated that I will add some diuretic and potassium supplement for her to take 1 of each daily each morning. Reviewed that blood pressure is 118/78 today.   Her only blood pressure medication is metoprolol.  Currently on no diuretic.   Also reviewed that lab 05/09/2018 showed sodium 133, potassium 3.7, BUN 12, creatinine 0.51. Given her age of 23 and her very frail state and blood pressure 118/78 cannot use high dose diuretic. Discussed with daughter that it may take several days for the swelling to improve and discussed reasons cannot use high dose medicine to rapidly get rid of swelling.  - US Venous Img Lower Bilateral; Future - VAS [place] LOWER EXTREMITY VENOUS (DVT) - furosemide (LASIX) 20 MG tablet; Take 1 tablet (20 mg total) by mouth daily.  Dispense: 30 tablet; Refill: 0 - potassium chloride (K-DUR) 10 MEQ tablet; Take 1 tablet (10 mEq total) by  mouth daily.  Dispense: 30 tablet; Refill: 0  3. Severe protein-calorie malnutrition ([place]) Discussed with daughter that low protein in bloodstream causes extravasation of fluids and can contribute to edema  4. Gait instability Also I have reviewed the palliative care consult note dated 05/30/2018.  Follow-up of this we did get information that that evaluation did show that she does meet criteria to move to hospice so will proceed with referral to hospice for additional comfort care and support.  5. Other osteoporosis, unspecified pathological fracture presence Also I have reviewed the palliative care consult note dated 05/30/2018.  Follow-up of this we did get information that that evaluation did show that she does meet criteria to move to hospice so will proceed with referral to hospice for additional comfort care and support.  6. Hospice care Also I have reviewed the palliative care consult note dated 05/30/2018.  Follow-up of this we did get information that that evaluation did show that she does meet criteria to move to hospice so will proceed with referral to hospice for additional comfort care and support.   Discussed with daughter plans for follow-up.  Ideally patient would be coming back here for reevaluation/reexamination and follow-up lab work.  However it is very painful and difficult for patient to come in.  Also proceeding with hospice care.  Therefore will wait to get input from hospice regarding these follow-up plans.  All of this was discussed with daughter and she voices understanding and is in agreement.  Marin Olp [place], [place], [facility] 06/01/2018 7:57 AM

## 2018-06-01 NOTE — Telephone Encounter (Signed)
Refill on oxycodone to [facility] [address]

## 2018-06-01 NOTE — Telephone Encounter (Signed)
Ok to refill??  Last office visit 05/31/2018.  Last refill 05/16/2018.

## 2018-06-01 NOTE — Telephone Encounter (Signed)
Hospice was going to manage this..?  I will refill this time, but the note yesterday did say that hospice was going to manage the pain meds.

## 2018-06-01 NOTE — Telephone Encounter (Signed)
Hospice Referral Approved, Needed.

## 2018-06-06 ENCOUNTER — Telehealth: Payer: Self-pay | Admitting: *Deleted

## 2018-06-06 NOTE — Telephone Encounter (Signed)
Received call from [place], [facility] nurse.   States that Hospice MD is going to take over care of pain management as of 06/06/2018 per orders from PCP.

## 2018-06-14 ENCOUNTER — Ambulatory Visit: Payer: Medicare HMO | Admitting: Family Medicine

## 2018-07-07 DEATH — deceased

## 2019-11-20 IMAGING — CT CT ABD-PELV W/ CM
2 of 8 series · 15 of 46 positions shown, 17 images · IV contrast (Isovue)
Comparison: 06/09/2016 pelvic radiograph

CLINICAL DATA: [AGE] female with constipation presents with
right lower abdominal pain and lower back pain with nausea and
vomiting starting last week.

EXAM:
CT ABDOMEN AND PELVIS WITH CONTRAST
TECHNIQUE: Multidetector CT imaging of the abdomen and pelvis was performed
using the standard protocol following bolus administration of
intravenous contrast.
CONTRAST:  30mL [id number] IOPAMIDOL (YV7I9F-ELL) INJECTION 61%,
75mL [id number] IOPAMIDOL (YV7I9F-ELL) INJECTION 61%

[Series 2: axial st · axial · 0.62mm/px · z∈[-466,-176]mm · 12 of 68 slices shown, 14 images]
[im 5/68  soft-tissue]
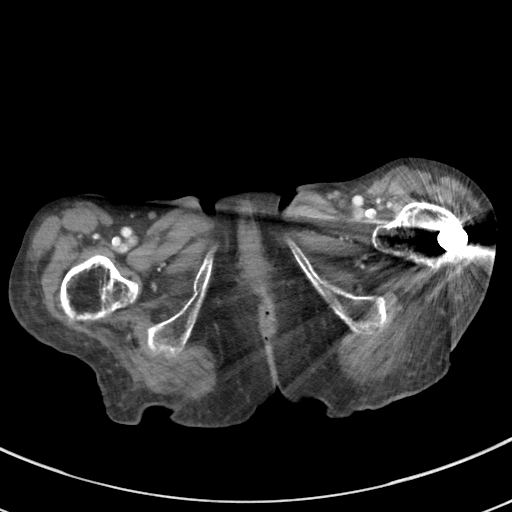
[im 5/68  bone]
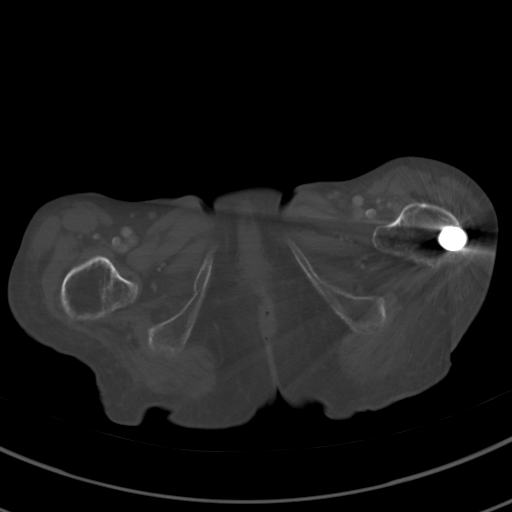
[im 9/68  soft-tissue]
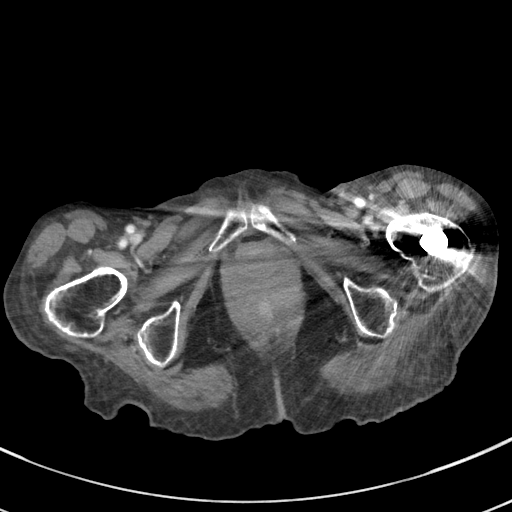
[im 14/68  soft-tissue]
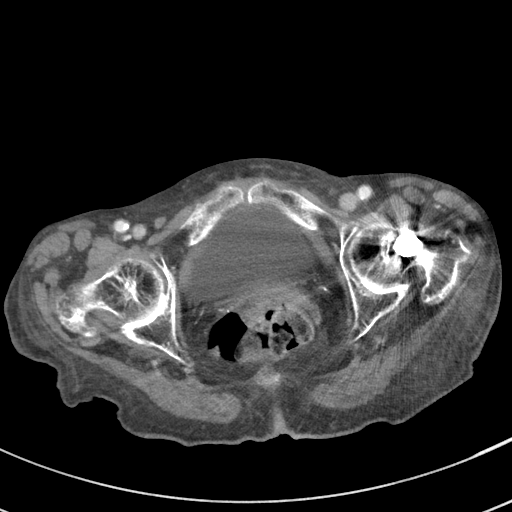
[im 23/68  soft-tissue]
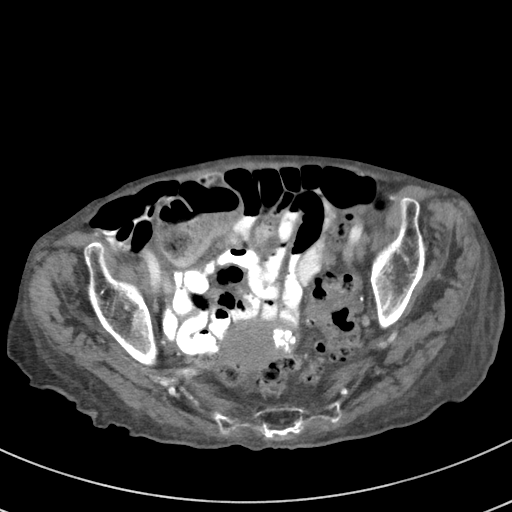
[im 27/68  soft-tissue]
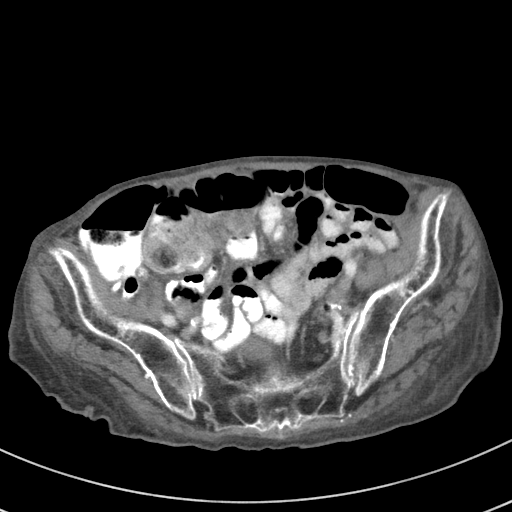
[im 32/68  soft-tissue]
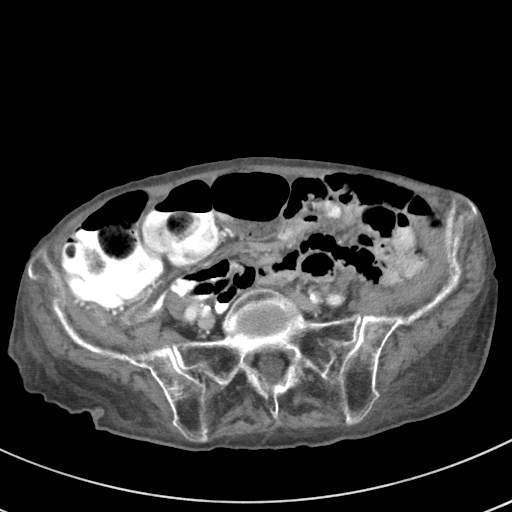
[im 36/68  soft-tissue]
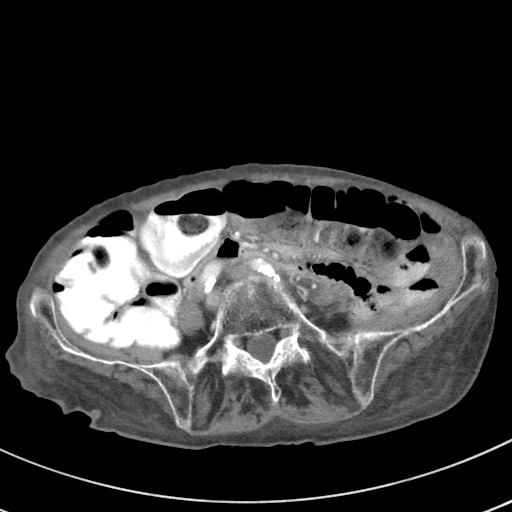
[im 41/68  soft-tissue]
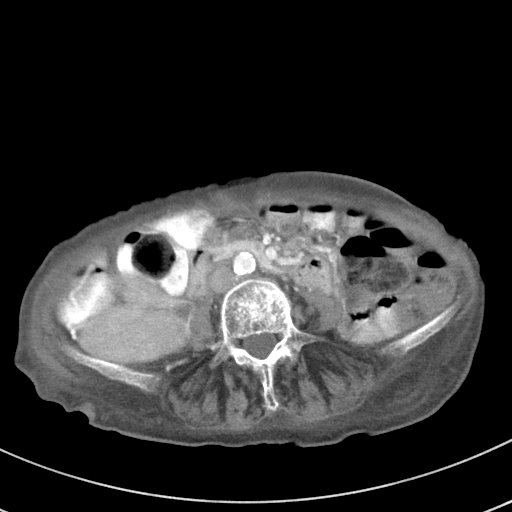
[im 45/68  soft-tissue]
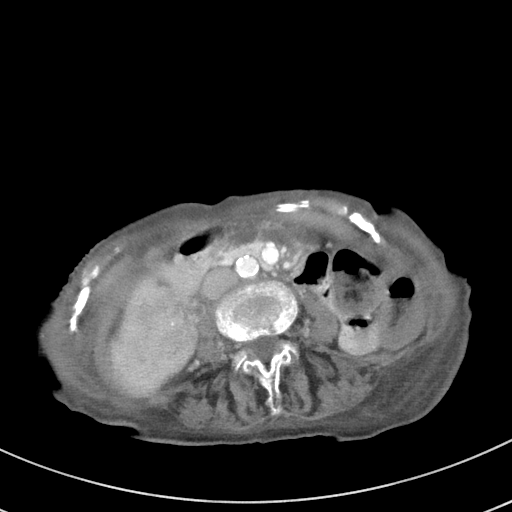
[im 45/68  bone]
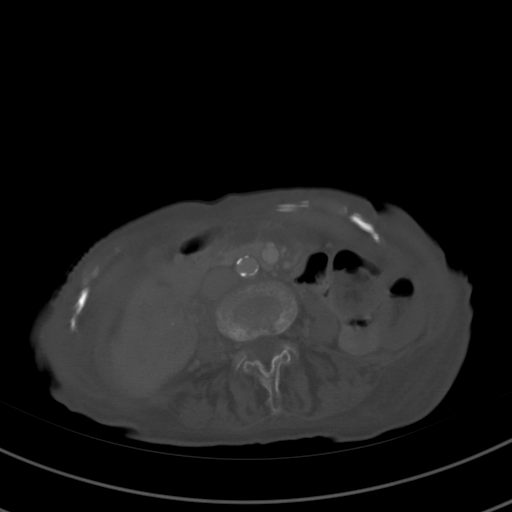
[im 54/68  soft-tissue]
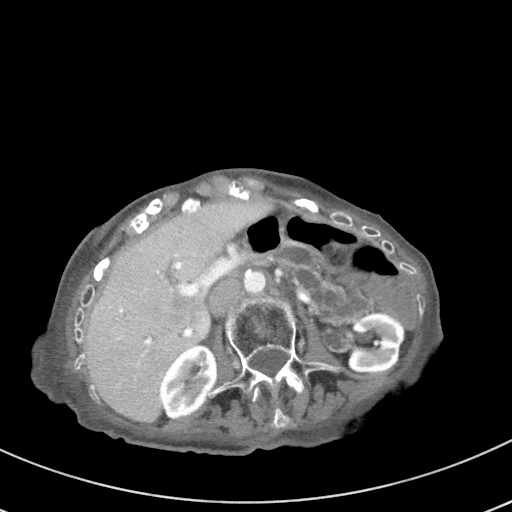
[im 59/68  soft-tissue]
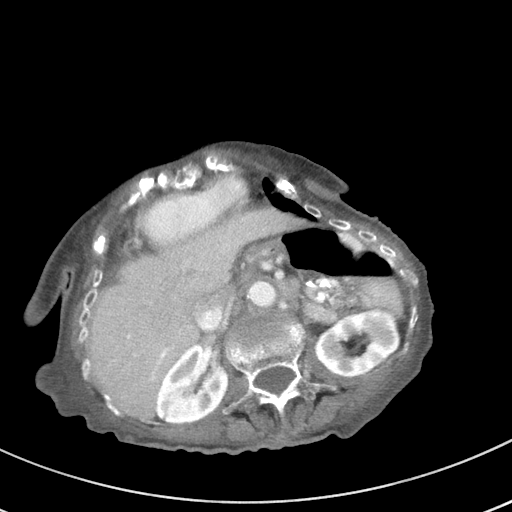
[im 63/68  soft-tissue]
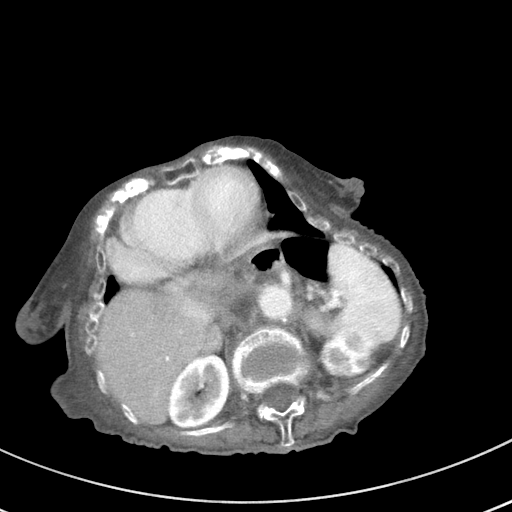

[Series 5: coronal st · coronal · 0.59mm/px · 3 of 73 slices shown]
[im 19/73  soft-tissue]
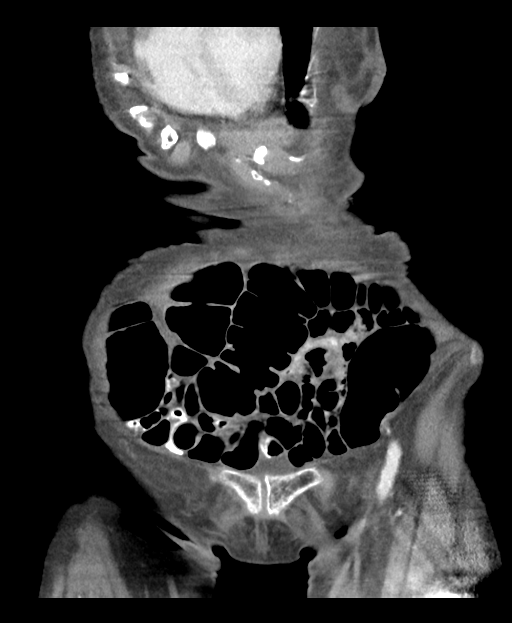
[im 37/73  soft-tissue]
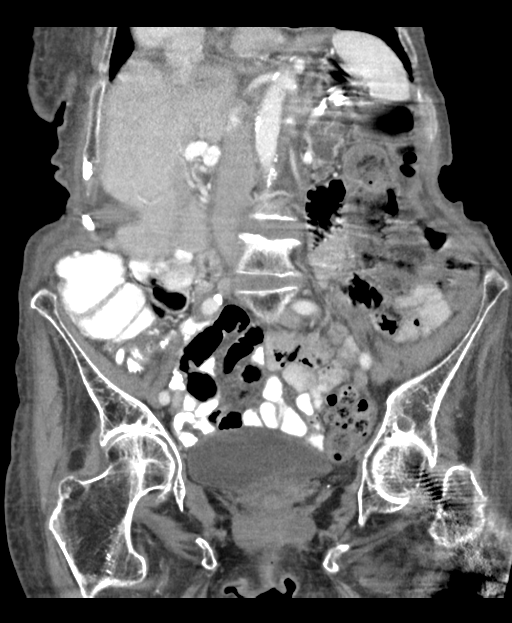
[im 55/73  soft-tissue]
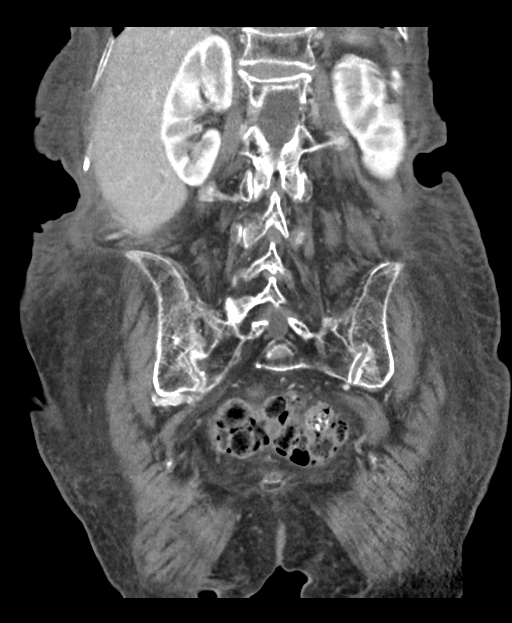

[15 of 46 positions shown; findings below may reference images not displayed]

FINDINGS: Lower chest: Large hiatal hernia containing much of the stomach.
Cardiomegaly without pericardial effusion is identified. Trace left
effusion with subsegmental bibasilar atelectasis.

Hepatobiliary: Gallbladder appears contracted and is free of stones.
Homogeneous enhancement of the liver given limitations due to motion
artifacts involving the right lower lobe. The caudal aspect of the
right hepatic lobe is slightly more heterogeneous in appearance as
result.

Pancreas: Atrophic with marked ectasia of the pancreatic duct.
Cannot exclude the possibility of small cystic lesions of the
pancreas largest approximately 18 mm in diameter. Intraductal
papillary mucinous neoplasms, side branch IPMN and pseudocysts or
combination thereof might account for some of the appearance. No
definite inflammation of the pancreas.

Spleen: Normal size spleen without mass.

Adrenals/Urinary Tract: No adrenal mass. Renal cortical thinning
bilaterally without mass or obstructive uropathy. No
nephrolithiasis. The urinary bladder is unremarkable for the degree
of distention.

Stomach/Bowel: Increased fecal retention within the colon. No bowel
obstruction. Large hiatal hernia is identified containing much of
the stomach. No small bowel obstruction or dilatation. Scattered
colonic diverticulosis without acute diverticulitis. Normal appendix
is visualized.

Vascular/Lymphatic: Aortoiliac and branch vessel atherosclerosis
without aneurysm. No lymphadenopathy by CT size criteria is
identified.

Reproductive: Calcified uterine fibroids on the left measuring up to
1.7 cm.

Other: No free air nor free fluid.

Musculoskeletal: Mild-to-moderate superior endplate compressions of
L1 and L4 with diffuse generalized osteopenia. Osteoarthritis of
both hip joints with joint space narrowing and subchondral cystic
change of the acetabular roofs. Indwelling left femoral nail
fixation of the left proximal femur is partially included.
IMPRESSION: 1. Increased colonic stool burden without bowel obstruction or
inflammation. Normal appendix without inflammatory change.
2. Large hiatal hernia containing much of the stomach. Cardiomegaly
without pericardial effusion is noted.
3. Mild heterogeneous appearance of the right hepatic lobe some
which is due to motion artifacts. No definite mass or biliary
dilatation.
4. Atrophic pancreas with marked ectasia cystic abnormalities of the
pancreas as above. No specific follow-up recommendations given
patient's age as imaging typically stops at age 80 on the consensus
guidelines.
5. Calcified uterine fibroids.
6. Age-indeterminate superior endplate compressions of L1 and L4.

## 2019-11-20 IMAGING — NM NM PULMONARY VENT & PERF
16 series · 16 of 16 positions shown · non-contrast
Comparison: Chest x-ray on 05/08/2018

CLINICAL DATA: Suspected pulmonary embolus. Complains of nausea,
vomiting, abdominal pain after not having a bowel movement for the
past 2 weeks.

EXAM:
NUCLEAR MEDICINE VENTILATION - PERFUSION LUNG SCAN
TECHNIQUE: Ventilation images were obtained in multiple projections using
inhaled aerosol Wc-AAm DTPA. Perfusion images were obtained in
multiple projections after intravenous injection of [id number].
RADIOPHARMACEUTICALS:  33.0 mCi of Wc-AAm DTPA aerosol inhalation
and 4.4 mCi [id number] IV

[Series 1: ant/post vent · 4.14mm/px · 1 of 1 slices shown (1 of 2)]
[im 1/1]
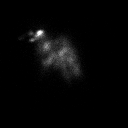

[Series 1: ant/post vent · 4.14mm/px · 1 of 1 slices shown (2 of 2)]
[im 1/1]
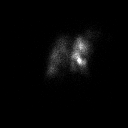

[Series 2: lao/rpo vent · 4.14mm/px · 1 of 1 slices shown (1 of 2)]
[im 1/1]
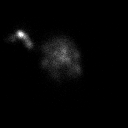

[Series 2: lao/rpo vent · 4.14mm/px · 1 of 1 slices shown (2 of 2)]
[im 1/1]
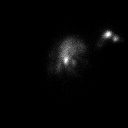

[Series 3: lt lat/rt lat vent · 4.14mm/px · 1 of 1 slices shown (1 of 2)]
[im 1/1]
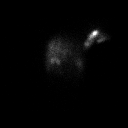

[Series 3: lt lat/rt lat vent · 4.14mm/px · 1 of 1 slices shown (2 of 2)]
[im 1/1]
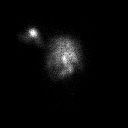

[Series 4: lpo/rao vent · 4.14mm/px · 1 of 1 slices shown (1 of 2)]
[im 1/1]
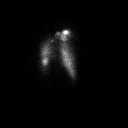

[Series 4: lpo/rao vent · 4.14mm/px · 1 of 1 slices shown (2 of 2)]
[im 1/1]
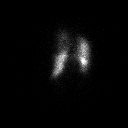

[Series 5: ant/post perf · 4.14mm/px · 1 of 1 slices shown (1 of 2)]
[im 1/1]
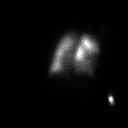

[Series 5: ant/post perf · 4.14mm/px · 1 of 1 slices shown (2 of 2)]
[im 1/1]
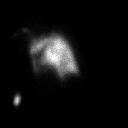

[Series 6: lao/rpo perf · 4.14mm/px · 1 of 1 slices shown (1 of 2)]
[im 1/1]
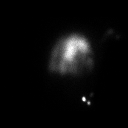

[Series 6: lao/rpo perf · 4.14mm/px · 1 of 1 slices shown (2 of 2)]
[im 1/1]
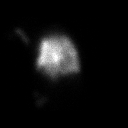

[Series 7: lt lat/rt lat perf · 4.14mm/px · 1 of 1 slices shown (1 of 2)]
[im 1/1]
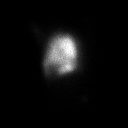

[Series 7: lt lat/rt lat perf · 4.14mm/px · 1 of 1 slices shown (2 of 2)]
[im 1/1]
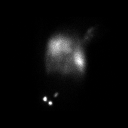

[Series 8: lpo/rao perf · 4.14mm/px · 1 of 1 slices shown (1 of 2)]
[im 1/1]
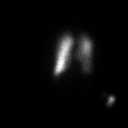

[Series 8: lpo/rao perf · 4.14mm/px · 1 of 1 slices shown (2 of 2)]
[im 1/1]
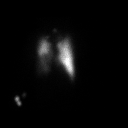

[16 of 16 positions shown; findings below may reference images not displayed]

FINDINGS: Ventilation: Patchy ventilation without large defect.

Perfusion: Sub-segmental pleural-based defects without evidence for
segmental or larger defect.
IMPRESSION: Low probability for clinically significant pulmonary embolus.
